# Patient Record
Sex: Male | Born: 1980 | Race: White | Hispanic: No | Marital: Married | Smoking: Never smoker
Health system: Southern US, Community
[De-identification: ages and names within clinical notes are randomized; demographics above are authoritative.]

## PROBLEM LIST (undated history)

## (undated) DIAGNOSIS — F419 Anxiety disorder, unspecified: Secondary | ICD-10-CM

---

## 2022-01-01 ENCOUNTER — Emergency Department (HOSPITAL_COMMUNITY): Payer: Managed Care, Other (non HMO)

## 2022-01-01 ENCOUNTER — Encounter (HOSPITAL_COMMUNITY): Payer: Self-pay | Admitting: *Deleted

## 2022-01-01 ENCOUNTER — Emergency Department (HOSPITAL_COMMUNITY)
Admission: EM | Admit: 2022-01-01 | Discharge: 2022-01-01 | Discharge status: Against Medical Advice | Payer: Managed Care, Other (non HMO) | Attending: Student | Admitting: Student

## 2022-01-01 ENCOUNTER — Other Ambulatory Visit: Payer: Self-pay

## 2022-01-01 DIAGNOSIS — R0789 Other chest pain: Secondary | ICD-10-CM | POA: Diagnosis not present

## 2022-01-01 DIAGNOSIS — R200 Anesthesia of skin: Secondary | ICD-10-CM | POA: Diagnosis not present

## 2022-01-01 DIAGNOSIS — R002 Palpitations: Secondary | ICD-10-CM | POA: Diagnosis not present

## 2022-01-01 DIAGNOSIS — Z5321 Procedure and treatment not carried out due to patient leaving prior to being seen by health care provider: Secondary | ICD-10-CM | POA: Diagnosis not present

## 2022-01-01 DIAGNOSIS — R079 Chest pain, unspecified: Secondary | ICD-10-CM

## 2022-01-01 HISTORY — DX: Anxiety disorder, unspecified: F41.9

## 2022-01-01 LAB — CBC
HCT: 45.6 % (ref 39.0–52.0)
Hemoglobin: 15.5 g/dL (ref 13.0–17.0)
MCH: 31.1 pg (ref 26.0–34.0)
MCHC: 34 g/dL (ref 30.0–36.0)
MCV: 91.4 fL (ref 80.0–100.0)
Platelets: 295 10*3/uL (ref 150–400)
RBC: 4.99 MIL/uL (ref 4.22–5.81)
RDW: 12.3 % (ref 11.5–15.5)
WBC: 11.7 10*3/uL — ABNORMAL HIGH (ref 4.0–10.5)
nRBC: 0 % (ref 0.0–0.2)

## 2022-01-01 LAB — RAPID URINE DRUG SCREEN, HOSP PERFORMED
Amphetamines: NOT DETECTED
Barbiturates: NOT DETECTED
Benzodiazepines: NOT DETECTED
Cocaine: NOT DETECTED
Opiates: NOT DETECTED
Tetrahydrocannabinol: NOT DETECTED

## 2022-01-01 LAB — TSH: TSH: 1.332 u[IU]/mL (ref 0.350–4.500)

## 2022-01-01 LAB — D-DIMER, QUANTITATIVE: D-Dimer, Quant: 0.36 ug/mL-FEU (ref 0.00–0.50)

## 2022-01-01 LAB — BASIC METABOLIC PANEL
Anion gap: 11 (ref 5–15)
BUN: 16 mg/dL (ref 6–20)
CO2: 24 mmol/L (ref 22–32)
Calcium: 10 mg/dL (ref 8.9–10.3)
Chloride: 102 mmol/L (ref 98–111)
Creatinine, Ser: 1.3 mg/dL — ABNORMAL HIGH (ref 0.61–1.24)
GFR, Estimated: >60 mL/min (ref >60)
Glucose, Bld: 113 mg/dL — ABNORMAL HIGH (ref 70–99)
Potassium: 4 mmol/L (ref 3.5–5.1)
Sodium: 137 mmol/L (ref 135–145)

## 2022-01-01 LAB — TROPONIN I (HIGH SENSITIVITY)
Troponin I (High Sensitivity): 6 ng/L
Troponin I (High Sensitivity): 5 ng/L (ref <18)

## 2022-01-01 LAB — MAGNESIUM: Magnesium: 2.2 mg/dL (ref 1.7–2.4)

## 2022-01-01 NOTE — ED Provider Triage Note (Signed)
Emergency Medicine Provider Triage Evaluation Note  Douglas Gonzalez , a 41 y.o. male  was evaluated in triage.  Pt complains of palpitations and left arm numbness ongoing for 1 week.  Patient states he has noticed his heart rate has ranged between 100 and 130 consistently during that time.  He denies shortness of breath, fever, loss of consciousness.  His medical history significant for anxiety and he takes both BuSpar and Seroquel daily  Review of Systems  Positive: Tachycardia, left arm numbness, chest pressure Negative: Loss of consciousness, jaw pain, fever  Physical Exam  BP (!) 166/114 (BP Location: Right Arm)    Pulse (!) 134    Temp 98.4 F (36.9 C) (Oral)    Resp 17    SpO2 99%  Gen:   Awake, no distress   Resp:  Normal effort, CTA bilaterally MSK:   Moves extremities without difficulty. Other:  Heart rate tachycardic, regular rhythm, skin diaphoretic  Medical Decision Making  Medically screening exam initiated at 1:47 PM.  Appropriate orders placed.  Douglas Gonzalez was informed that the remainder of the evaluation will be completed by another provider, this initial triage assessment does not replace that evaluation, and the importance of remaining in the ED until their evaluation is complete.     Douglas Gonzalez, [place] 01/01/22 1409

## 2022-01-01 NOTE — ED Triage Notes (Signed)
Pt reports HR being greater than 120 intermittent x 1 week, causing chest discomfort. Also having left side hand/finger numbness that radiates up his arm. Denies hx of tachycardia prior to this.

## 2022-01-01 NOTE — ED Notes (Signed)
Pt left and MSE signed  

## 2023-03-01 DIAGNOSIS — N62 Hypertrophy of breast: Secondary | ICD-10-CM | POA: Diagnosis not present

## 2023-03-01 DIAGNOSIS — Z6828 Body mass index (BMI) 28.0-28.9, adult: Secondary | ICD-10-CM | POA: Diagnosis not present

## 2023-03-01 DIAGNOSIS — E663 Overweight: Secondary | ICD-10-CM | POA: Diagnosis not present

## 2023-03-01 DIAGNOSIS — L709 Acne, unspecified: Secondary | ICD-10-CM | POA: Diagnosis not present

## 2023-03-01 DIAGNOSIS — Z0000 Encounter for general adult medical examination without abnormal findings: Secondary | ICD-10-CM | POA: Diagnosis not present

## 2023-03-01 DIAGNOSIS — L2084 Intrinsic (allergic) eczema: Secondary | ICD-10-CM | POA: Diagnosis not present

## 2023-03-01 DIAGNOSIS — J3089 Other allergic rhinitis: Secondary | ICD-10-CM | POA: Diagnosis not present

## 2023-03-01 DIAGNOSIS — Z79899 Other long term (current) drug therapy: Secondary | ICD-10-CM | POA: Diagnosis not present

## 2023-03-03 DIAGNOSIS — Z0000 Encounter for general adult medical examination without abnormal findings: Secondary | ICD-10-CM | POA: Diagnosis not present

## 2023-03-03 DIAGNOSIS — R899 Unspecified abnormal finding in specimens from other organs, systems and tissues: Secondary | ICD-10-CM | POA: Diagnosis not present

## 2023-03-03 DIAGNOSIS — Z1159 Encounter for screening for other viral diseases: Secondary | ICD-10-CM | POA: Diagnosis not present

## 2023-03-18 DIAGNOSIS — F331 Major depressive disorder, recurrent, moderate: Secondary | ICD-10-CM | POA: Diagnosis not present

## 2023-03-18 DIAGNOSIS — F410 Panic disorder [episodic paroxysmal anxiety] without agoraphobia: Secondary | ICD-10-CM | POA: Diagnosis not present

## 2023-03-18 DIAGNOSIS — F411 Generalized anxiety disorder: Secondary | ICD-10-CM | POA: Diagnosis not present

## 2023-04-15 DIAGNOSIS — F411 Generalized anxiety disorder: Secondary | ICD-10-CM | POA: Diagnosis not present

## 2023-04-15 DIAGNOSIS — F410 Panic disorder [episodic paroxysmal anxiety] without agoraphobia: Secondary | ICD-10-CM | POA: Diagnosis not present

## 2023-04-15 DIAGNOSIS — F331 Major depressive disorder, recurrent, moderate: Secondary | ICD-10-CM | POA: Diagnosis not present

## 2023-04-27 DIAGNOSIS — M4317 Spondylolisthesis, lumbosacral region: Secondary | ICD-10-CM | POA: Diagnosis not present

## 2023-04-27 DIAGNOSIS — Z79899 Other long term (current) drug therapy: Secondary | ICD-10-CM | POA: Diagnosis not present

## 2023-04-29 DIAGNOSIS — F411 Generalized anxiety disorder: Secondary | ICD-10-CM | POA: Diagnosis not present

## 2023-04-29 DIAGNOSIS — F410 Panic disorder [episodic paroxysmal anxiety] without agoraphobia: Secondary | ICD-10-CM | POA: Diagnosis not present

## 2023-04-29 DIAGNOSIS — F331 Major depressive disorder, recurrent, moderate: Secondary | ICD-10-CM | POA: Diagnosis not present

## 2023-05-12 DIAGNOSIS — J029 Acute pharyngitis, unspecified: Secondary | ICD-10-CM | POA: Diagnosis not present

## 2023-05-12 DIAGNOSIS — J209 Acute bronchitis, unspecified: Secondary | ICD-10-CM | POA: Diagnosis not present

## 2023-05-19 DIAGNOSIS — F410 Panic disorder [episodic paroxysmal anxiety] without agoraphobia: Secondary | ICD-10-CM | POA: Diagnosis not present

## 2023-05-19 DIAGNOSIS — F411 Generalized anxiety disorder: Secondary | ICD-10-CM | POA: Diagnosis not present

## 2023-05-19 DIAGNOSIS — F331 Major depressive disorder, recurrent, moderate: Secondary | ICD-10-CM | POA: Diagnosis not present

## 2023-06-28 ENCOUNTER — Encounter: Payer: Self-pay | Admitting: Behavioral Health

## 2023-06-28 ENCOUNTER — Ambulatory Visit (INDEPENDENT_AMBULATORY_CARE_PROVIDER_SITE_OTHER): Payer: 59 | Admitting: Behavioral Health

## 2023-06-28 DIAGNOSIS — F422 Mixed obsessional thoughts and acts: Secondary | ICD-10-CM

## 2023-06-28 DIAGNOSIS — F410 Panic disorder [episodic paroxysmal anxiety] without agoraphobia: Secondary | ICD-10-CM

## 2023-06-28 DIAGNOSIS — F411 Generalized anxiety disorder: Secondary | ICD-10-CM | POA: Diagnosis not present

## 2023-06-28 MED ORDER — ALPRAZOLAM 0.5 MG PO TABS
0.5000 mg | ORAL_TABLET | Freq: Two times a day (BID) | ORAL | 0 refills | Status: DC | PRN
Start: 2023-06-28 — End: 2023-07-22

## 2023-06-28 MED ORDER — ALPRAZOLAM ER 1 MG PO TB24
1.0000 mg | ORAL_TABLET | Freq: Two times a day (BID) | ORAL | 0 refills | Status: DC
Start: 2023-06-28 — End: 2023-07-22

## 2023-06-28 NOTE — Progress Notes (Signed)
[facility] MD/PA/NP Initial Note  06/28/2023 6:17 PM Douglas Gonzalez  MRN:  [id number]  Chief Complaint:  Chief Complaint   Anxiety; Establish Care; Medication Refill; Patient Education; Medication Problem; OCD     HPI:  "Douglas Gonzalez", 42 year old male presents to this office for initial visit and to establish care.  Collateral information should be considered reliable.  Most recently getting psychiatric care from [facility] in [facility].  Patient is very articulate and detailed.  Very thorough and expressing his concerns.  Appears to be very fixated on details that may collaborate with his previous OCD diagnosis.  He says that he was first exposed to mental health care at the age of 46.  He has received psychiatric care from several different providers since that time.  Says that as a young child his parents noticed that he worried a lot and sometimes he would have ritualistic behaviors or preoccupation with counting.  Says that with his OCD he suffers from severe anxiety and panic disorder.  Feels like depression is mild.  Says that his father passed from pancreatic cancer in June 2023, and feels like this may have triggered some exacerbation of his OCD.  He also experienced an increase in EtOH consumption to an unhealthy level.  Says that he has not had any EtOH in approximately 8 months.  Then in February 2024 he was laid off from his job adding to his stress.  Says that he is here today to find a new provider that can help manage his medications but also hopefully find a better medication regimen.  He says that he has a very long history of psychiatric medication trials but candidly admits to not giving all of the medication enough time to work.  He also is currently taking a higher dose of Xanax which was previously prescribed.  He regularly uses opioid pain medication for chronic back pain.  He says his anxiety today is 5/10, and depression is 3/10.  Concerned about his lack of quality sleep  and reports only getting about 3 to 4 hours per night.  He is interested in completing GeneSight testing.  His PHQ 2 was negative.  His MDQ was negative with only 2 criteria on Marked yes.  He endorses racing thoughts and trouble concentrating.  He denies any current mania, no history of psychosis, no auditory or visual hallucinations.  He denies SI or HI.  He verbally contracts for safety with this Clinical research associate.  Past psychiatric medication trials: Zoloft Effexor Rexulti BuSpar Xanax Klonopin Celexa Lexapro Paxil Wellbutrin Seroquel Abilify Lunesta Lamictal Ativan Propranolol Hydroxyzine Gabapentin Vraylar BuSpar     Visit Diagnosis:    ICD-10-CM   1. Generalized anxiety disorder  F41.1 ALPRAZolam (XANAX XR) 1 MG 24 hr tablet    ALPRAZolam (XANAX) 0.5 MG tablet    2. Mixed obsessional thoughts and acts  F42.2     3. Panic attack  F41.0 ALPRAZolam (XANAX XR) 1 MG 24 hr tablet    ALPRAZolam (XANAX) 0.5 MG tablet      Past Psychiatric History: OCD, Gen Anxiety, Panic Disorder  Past Medical History:  Past Medical History:  Diagnosis Date   Anxiety    History reviewed. No pertinent surgical history.  Family Psychiatric History: none noted this visit. Time restriction, obtain next visit.  Family History: History reviewed. No pertinent family history.  Social History:  Social History   Socioeconomic History   Marital status: Married    Spouse name: Not on file   Number of children:  Not on file   Years of education: Not on file   Highest education level: Not on file  Occupational History   Not on file  Tobacco Use   Smoking status: Never   Smokeless tobacco: Current  Substance and Sexual Activity   Alcohol use: Yes   Drug use: Never   Sexual activity: Not on file  Other Topics Concern   Not on file  Social History Narrative   Not on file   Social Determinants of Health   Financial Resource Strain: Low Risk  (06/08/2022)   [facility],  [facility] prior to 01/23/[facility]23[facility] prior to 01/23/2023.   Overall Financial Resource Strain (CARDIA)    Difficulty of Paying Living Expenses: Not hard at all  Food Insecurity: Low Risk  (02/25/2023)   [facility]   Food vital sign    Within the past 12 months, you worried that your food would run out before you got money to buy more: Never true    Within the past 12 months, the food you bought just didn't last and you didn't have money to get more. : Never true  Transportation Needs: No Transportation Needs (02/25/2023)   [facility]   Transportation    In the past 12 months, has lack of reliable transportation kept you from medical appointments, meetings, work or from getting things needed for daily living? : No  Physical Activity: Insufficiently Active (06/08/2022)   [facility], [facility] prior to 01/23/2023., [facility] visits prior to 01/23/2023.   Exercise Vital Sign    Days of Exercise per Week: 1 day    Minutes of Exercise per Session: 30 min  Stress: Stress Concern Present (06/08/2022)   [facility], [facility] prior to 01/23/2023., [facility] visits prior to 01/23/2023.   [employer] of Occupational Health - Occupational Stress Questionnaire    Feeling of Stress : Very much  Social Connections: Moderately Integrated (06/08/2022)   [facility], [facility] prior to 01/23/2023., [facility] visits prior to 01/23/2023.   Social Advertising account executive [NHANES]    Frequency of Communication with Friends and Family: More than three times a week    Frequency of Social Gatherings with Friends and Family: More  than three times a week    Attends Religious Services: More than 4 times per year    Active Member of [employer] or Organizations: No    Attends Banker Meetings: Never    Marital Status: Married    Allergies:  Allergies  Allergen Reactions   Neosporin [Bacitracin-Polymyxin B]     Metabolic Disorder Labs: No results found for: "HGBA1C", "MPG" No results found for: "PROLACTIN" No results found for: "CHOL", "TRIG", "HDL", "CHOLHDL", "VLDL", "LDLCALC" Lab Results  Component Value Date   TSH 1.332 01/01/2022    Therapeutic Level Labs: No results found for: "LITHIUM" No results found for: "VALPROATE" No results found for: "CBMZ"  Current Medications: Current Outpatient Medications  Medication Sig Dispense Refill   ALPRAZolam (XANAX) 1 MG tablet Take by mouth.     cyclobenzaprine (FLEXERIL) 10 MG tablet Take by mouth.     fluticasone (FLONASE) 50 MCG/ACT nasal spray Place into the  nose.     naloxone (NARCAN) nasal spray 4 mg/0.1 mL Place into the nose.     ALPRAZolam (XANAX XR) 1 MG 24 hr tablet Take 1 tablet (1 mg total) by mouth 2 (two) times daily. 60 tablet 0   ALPRAZolam (XANAX) 0.5 MG tablet Take 1 tablet (0.5 mg total) by mouth 2 (two) times daily as needed for anxiety. 30 tablet 0   busPIRone (BUSPAR) 15 MG tablet Take 15 mg by mouth 3 (three) times daily.     montelukast (SINGULAIR) 10 MG tablet Take 10 mg by mouth daily.     zolpidem (AMBIEN) 10 MG tablet Take 10 mg by mouth at bedtime as needed. for sleep     No current facility-administered medications for this visit.    Medication Side Effects: none  Orders placed this visit:  No orders of the defined types were placed in this encounter.   Psychiatric Specialty Exam:  Review of Systems  Constitutional:  Positive for unexpected weight change.  Allergic/Immunologic: Positive for environmental allergies.  Neurological: Negative.   Psychiatric/Behavioral:  Positive for dysphoric mood and sleep  disturbance. The patient is nervous/anxious.     Blood pressure 125/79, pulse 75, height 6\' 4"  (1.93 m), weight 183 lb (83 kg).Body mass index is 22.28 kg/m.  General Appearance: Casual, Neat, Well Groomed, and Meticulous  Eye Contact:  Good  Speech:  Clear and Coherent and Talkative  Volume:  Normal  Mood:  Anxious and Depressed  Affect:  Depressed and Anxious  Thought Process:  Coherent  Orientation:  Full (Time, Place, and Person)  Thought Content: Obsessions and Rumination   Suicidal Thoughts:  No  Homicidal Thoughts:  No  Memory:  WNL  Judgement:  Fair  Insight:  Fair  Psychomotor Activity:  Normal  Concentration:  Concentration: Good  Recall:  Good  Fund of Knowledge: Good  Language: Good  Assets:  Desire for Improvement  ADL's:  Intact  Cognition: WNL  Prognosis:  Good   Screenings:  Flowsheet Row ED from [facility]/01/2022 in [facility] Emergency Department at [facility]  C-SSRS RISK CATEGORY No Risk       Receiving Psychotherapy: No   Treatment Plan/Recommendations:  Greater than 50% of  90 min face to face time with patient was spent on counseling and coordination of care. We discussed his long-term problems with OCD, anxiety, and depression.  We also discussed his very lengthy list of prior psychiatric medication trials.  We talked about his previous care under various providers since the age of 34.  We discussed his goals for care at this practice.  I had a very candid conversation with him about his lengthy list of medications and my concerns about his expectations, and barriers to certain medications.  He has a significant amount of anxiety and prerequisites for medications.  We conducted GeneSight packet today in hopes that it will reinforce confidence in future medication trials.  We will not start any new medication until we have discussed the results.  We also discussed my concerns about his healthy doses of benzo and his use of narcotic pain medication.   Patient does have a prescription of Narcan available.  Advised patient to not take Xanax at same time as pain medication.  Planning will involve patient being willing to wean down on his current Xanax dose.  We agreed today to: Will continue Xanax 1 mg XR twice daily Will continue 0.5 mg twice daily as needed for panic. To continue zolpidem 10  mg at bedtime Will continue BuSpar 15 mg 3 times daily Will report worsening symptoms or side effects promptly Provided emergency contact information To follow-up in 3 weeks to reassess Discussed potential benefits, risk, and side effects of benzodiazepines to include potential risk of tolerance and dependence, as well as possible drowsiness.  Advised patient not to drive if experiencing drowsiness and to take lowest possible effective dose to minimize risk of dependence and tolerance.  Reviewed PDMP   Joan Flores, NP

## 2023-06-29 ENCOUNTER — Telehealth: Payer: Self-pay

## 2023-06-29 IMAGING — DX DG CHEST 2V
2 series · 2 of 2 positions shown · non-contrast
Comparison: None

CLINICAL DATA: Chest pain, tachycardia, LEFT hand and finger
numbness radiating up arm

EXAM:
CHEST - 2 VIEW

[w chest pa]
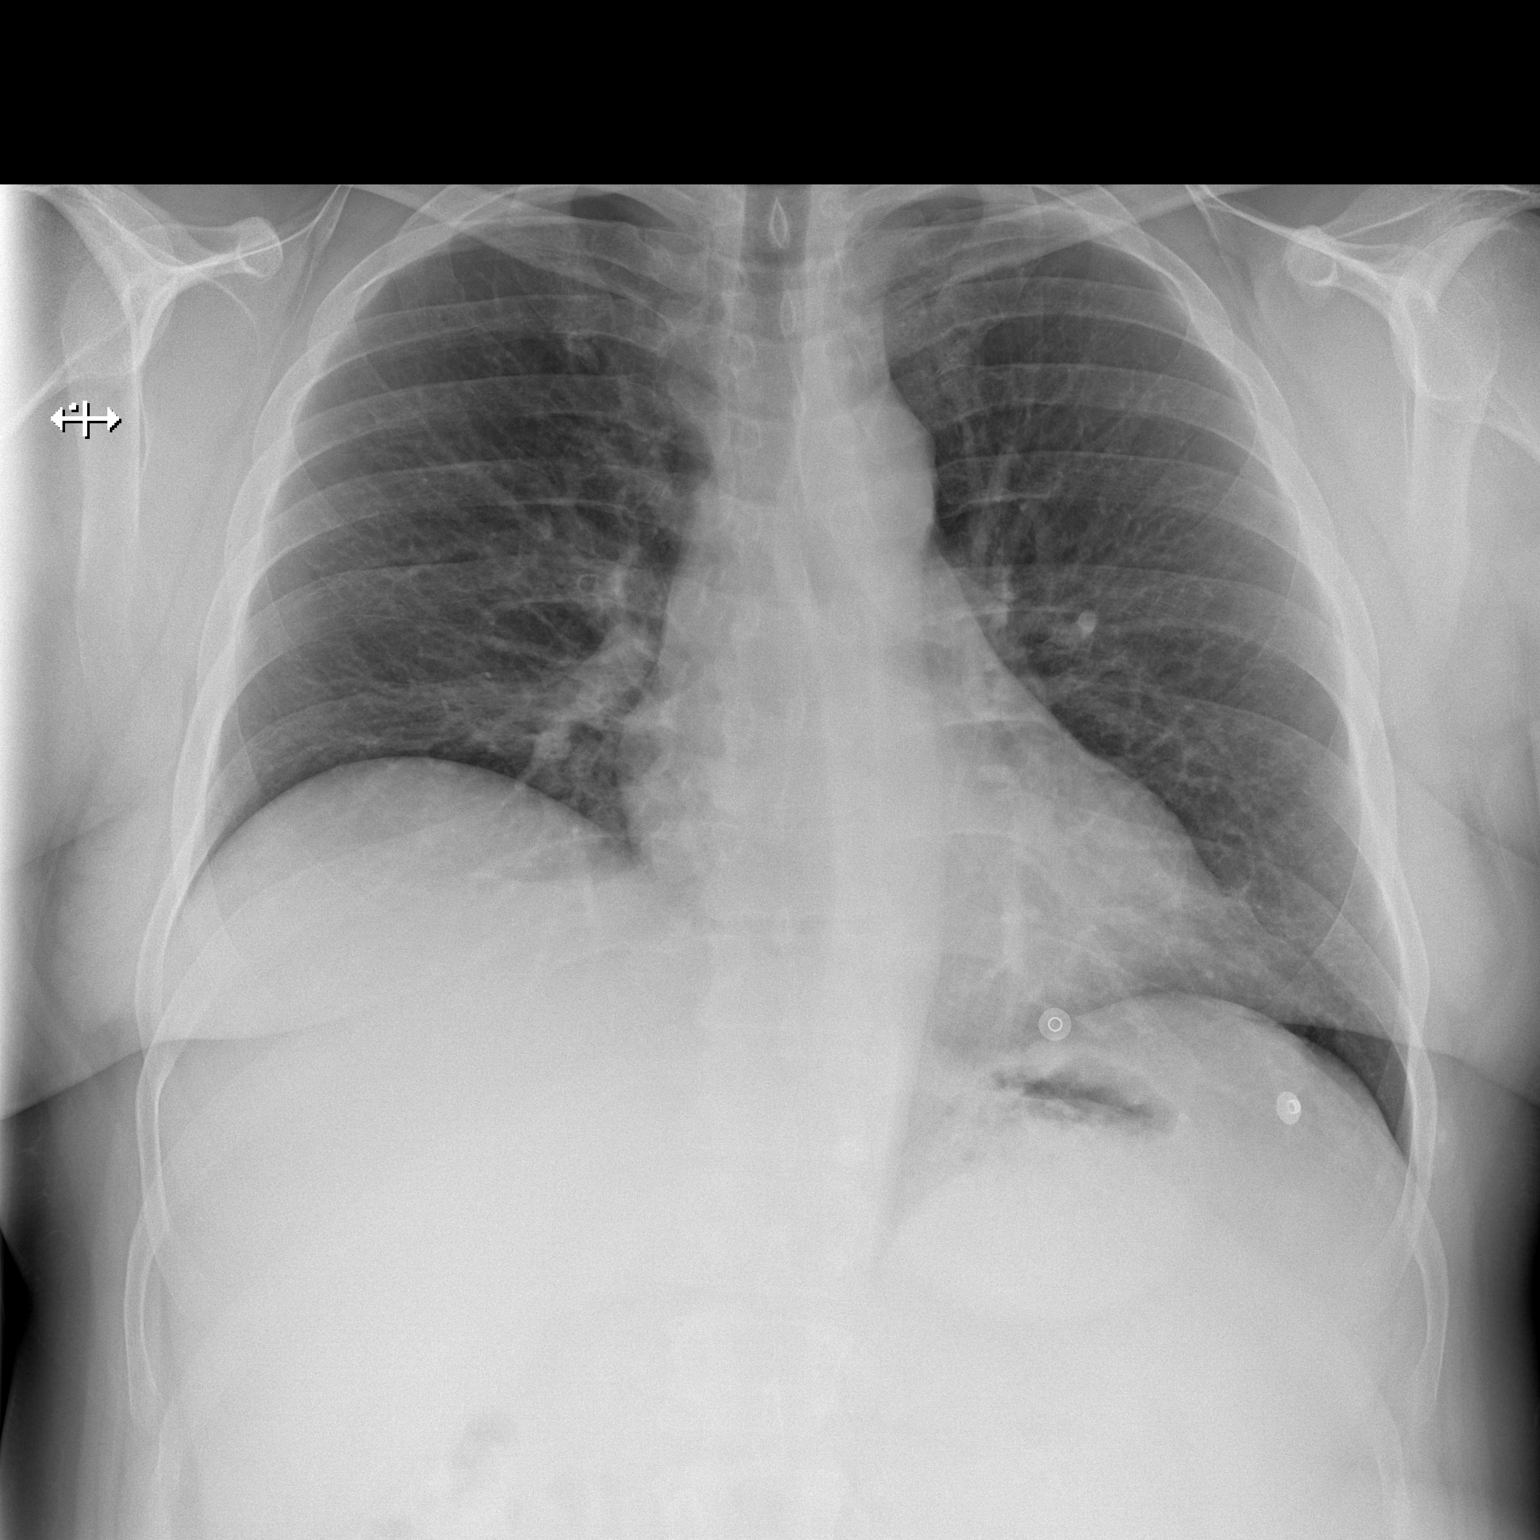

[w chest lat]
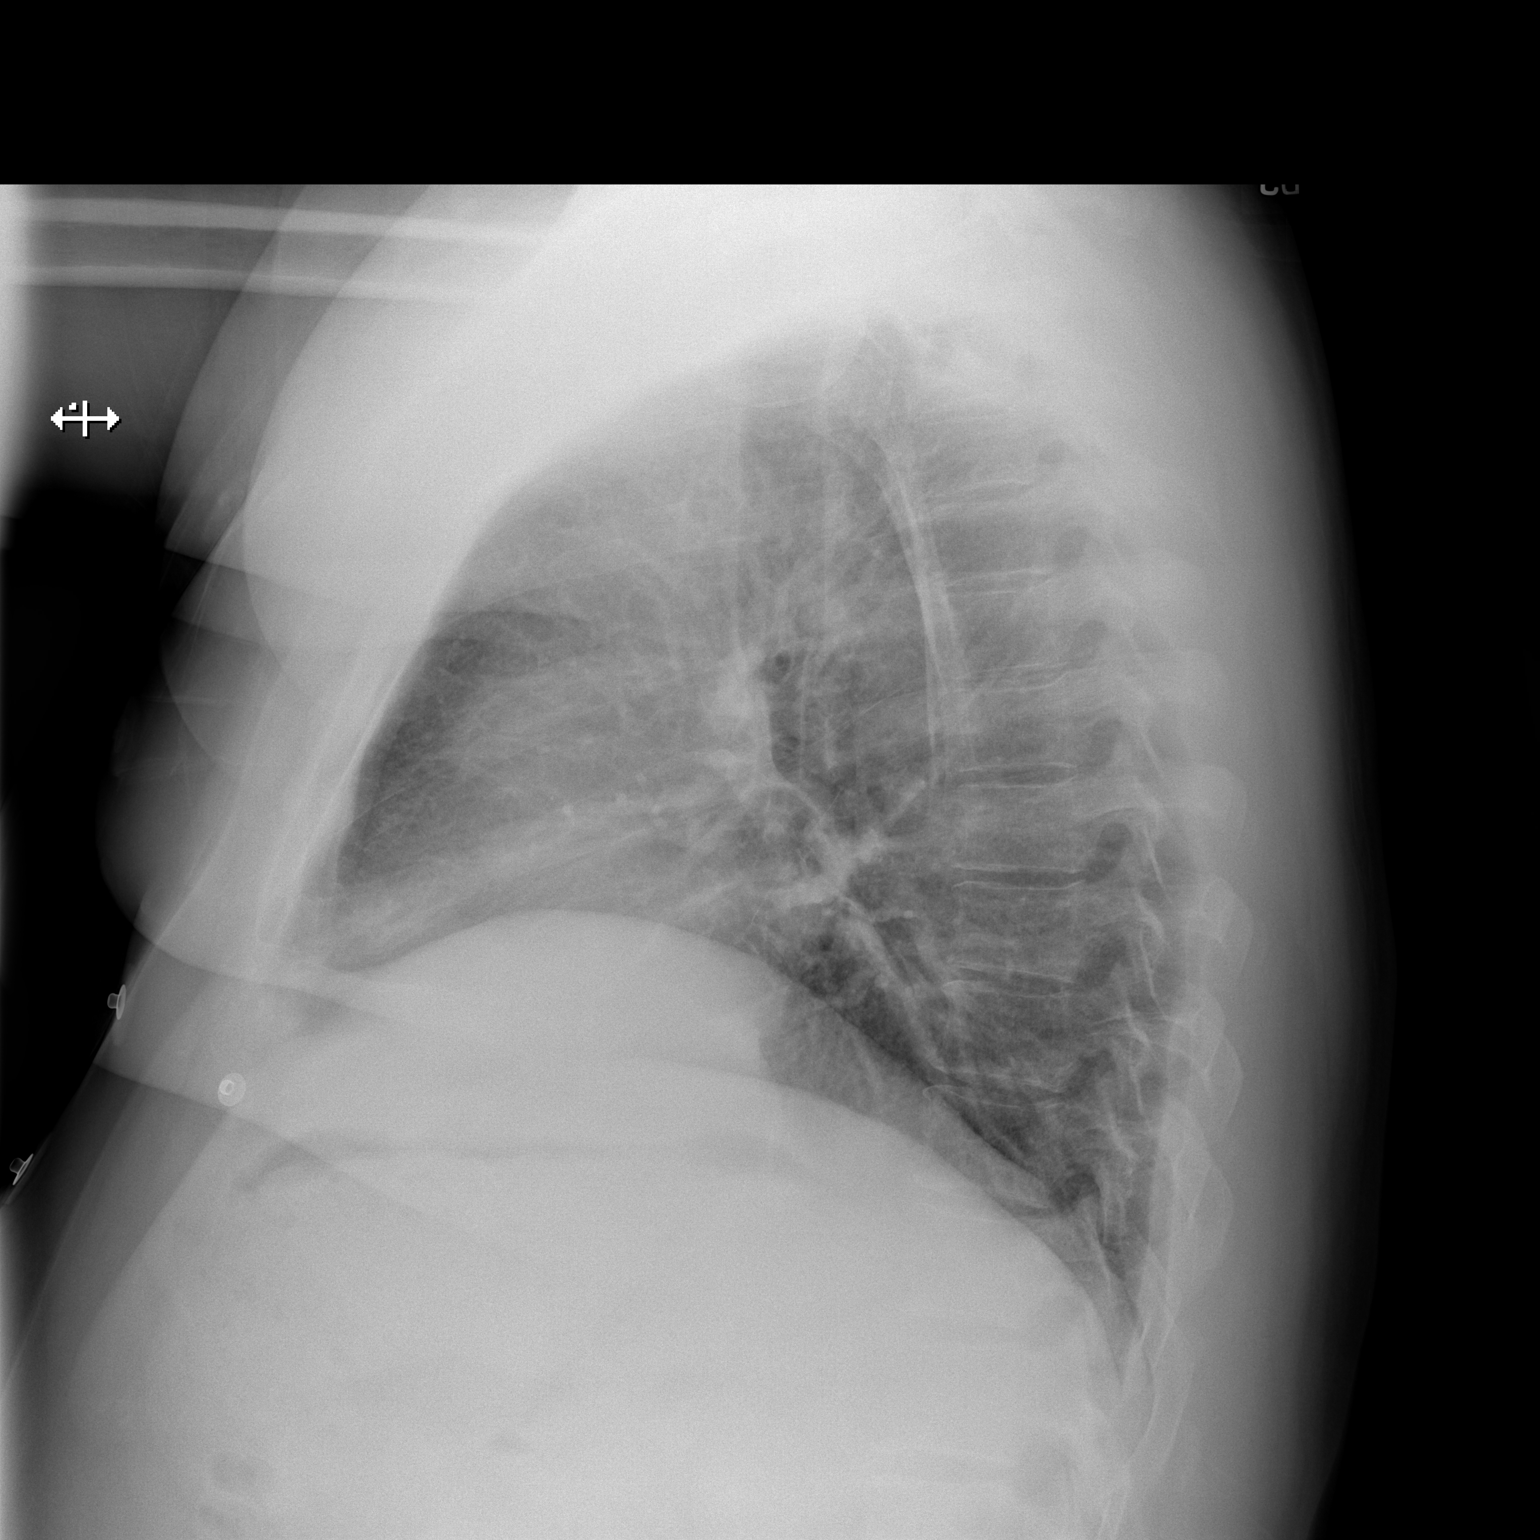

[2 of 2 positions shown; findings below may reference images not displayed]

FINDINGS: Upper-normal size of cardiac silhouette.

Mediastinal contours and pulmonary vascularity normal.

Elevation of RIGHT diaphragm with RIGHT basilar subsegmental
atelectasis.

Remaining lungs clear.

No infiltrate, pleural effusion, or pneumothorax.

Osseous structures unremarkable.
IMPRESSION: Subsegmental atelectasis RIGHT base.

## 2023-06-29 NOTE — Telephone Encounter (Signed)
Patient notified. He said that he has spondylolisthesis and that is why he takes the pain medication, but that the mind trumps the physical pain and if he needs more benzo then he can eliminate the pain medication. He has FU 8/28. I told him that Arlys John would re-evaluate at that time.

## 2023-07-08 ENCOUNTER — Telehealth: Payer: Self-pay | Admitting: Behavioral Health

## 2023-07-08 ENCOUNTER — Other Ambulatory Visit: Payer: Self-pay

## 2023-07-08 MED ORDER — BUSPIRONE HCL 15 MG PO TABS: 15.0000 mg | ORAL_TABLET | Freq: Three times a day (TID) | ORAL | 0 refills | Status: DC

## 2023-07-08 MED ORDER — ZOLPIDEM TARTRATE 10 MG PO TABS: 10.0000 mg | ORAL_TABLET | Freq: Every evening | ORAL | 0 refills | Status: DC | PRN

## 2023-07-08 NOTE — Telephone Encounter (Signed)
Patient called in for refills on Buspar 15mg  and Ambien 10mg . Ph: [phone] Appt 8/28 [facility] [address] [place]

## 2023-07-08 NOTE — Telephone Encounter (Signed)
Sent/pended 

## 2023-07-12 ENCOUNTER — Other Ambulatory Visit: Payer: Self-pay | Admitting: Behavioral Health

## 2023-07-12 DIAGNOSIS — F410 Panic disorder [episodic paroxysmal anxiety] without agoraphobia: Secondary | ICD-10-CM

## 2023-07-12 DIAGNOSIS — F411 Generalized anxiety disorder: Secondary | ICD-10-CM

## 2023-07-21 ENCOUNTER — Ambulatory Visit: Payer: 59 | Admitting: Behavioral Health

## 2023-07-22 ENCOUNTER — Ambulatory Visit: Payer: 59 | Admitting: Behavioral Health

## 2023-07-22 ENCOUNTER — Telehealth: Payer: Self-pay | Admitting: Behavioral Health

## 2023-07-22 ENCOUNTER — Encounter: Payer: Self-pay | Admitting: Behavioral Health

## 2023-07-22 DIAGNOSIS — F411 Generalized anxiety disorder: Secondary | ICD-10-CM | POA: Diagnosis not present

## 2023-07-22 DIAGNOSIS — F422 Mixed obsessional thoughts and acts: Secondary | ICD-10-CM

## 2023-07-22 DIAGNOSIS — F5105 Insomnia due to other mental disorder: Secondary | ICD-10-CM | POA: Diagnosis not present

## 2023-07-22 DIAGNOSIS — F410 Panic disorder [episodic paroxysmal anxiety] without agoraphobia: Secondary | ICD-10-CM

## 2023-07-22 DIAGNOSIS — F99 Mental disorder, not otherwise specified: Secondary | ICD-10-CM

## 2023-07-22 MED ORDER — ALPRAZOLAM 0.5 MG PO TABS
0.5000 mg | ORAL_TABLET | Freq: Two times a day (BID) | ORAL | 1 refills | Status: DC | PRN
Start: 2023-07-22 — End: 2023-08-04

## 2023-07-22 MED ORDER — BUSPIRONE HCL 15 MG PO TABS
15.0000 mg | ORAL_TABLET | Freq: Three times a day (TID) | ORAL | 1 refills | Status: DC
Start: 2023-07-22 — End: 2023-08-28

## 2023-07-22 MED ORDER — ALPRAZOLAM ER 1 MG PO TB24
1.0000 mg | ORAL_TABLET | Freq: Two times a day (BID) | ORAL | 1 refills | Status: DC
Start: 2023-07-22 — End: 2023-08-04

## 2023-07-22 MED ORDER — QUETIAPINE FUMARATE 100 MG PO TABS
ORAL_TABLET | ORAL | 1 refills | Status: DC
Start: 2023-07-22 — End: 2023-09-12

## 2023-07-22 NOTE — Telephone Encounter (Signed)
Douglas Gonzalez has not finished his note. He last filled alprazolam 8/5.

## 2023-07-22 NOTE — Telephone Encounter (Signed)
Scripts have been sent

## 2023-07-22 NOTE — Progress Notes (Signed)
[facility] Med Check  Patient ID: Douglas Gonzalez,  MRN: [id number]  PCP: Loyal Jacobson, MD  Date of Evaluation: 07/22/2023 Time spent:40 minutes  Chief Complaint:  Chief Complaint   Anxiety; Depression; Follow-up; Medication Refill; Patient Education; OCD     HISTORY/CURRENT STATUS: HPI  "Douglas Gonzalez", 42 year old male presents to this office for initial visit and to establish care.  He also is currently taking a higher dose of Xanax which was previously prescribed.  He regularly uses opioid pain medication for chronic back pain. He wanted to discuss medication alternatives today. Genesight results are not in yet. Would like to discuss sleeping medications because he is still struggling.   He says his anxiety today is 5/10, and depression is 3/10.  Concerned about his lack of quality sleep and reports only getting about 4-5 hours per night after he self initiated Seroquel again.  He would like to come back in two weeks to discuss AD'S.  He denies any current mania, no history of psychosis, no auditory or visual hallucinations.  He denies SI or HI.  He verbally contracts for safety with this Clinical research associate.   Past psychiatric medication trials: Zoloft Effexor Rexulti BuSpar Xanax Klonopin Celexa Lexapro Paxil Wellbutrin Seroquel Abilify Lunesta Lamictal Ativan Propranolol Hydroxyzine Gabapentin Vraylar BuSpar       Individual Medical History/ Review of Systems: Changes? :No   Allergies: Neosporin [bacitracin-polymyxin b]  Current Medications:  Current Outpatient Medications:    QUEtiapine (SEROQUEL) 100 MG tablet, Take 1/2 to 1 tablet by mouth at bedtime daily for insomnia, and anxiety., Disp: 30 tablet, Rfl: 1   ALPRAZolam (XANAX XR) 1 MG 24 hr tablet, Take 1 tablet (1 mg total) by mouth 2 (two) times daily., Disp: 60 tablet, Rfl: 1   ALPRAZolam (XANAX) 0.5 MG tablet, Take 1 tablet (0.5 mg total) by mouth 2 (two) times daily as needed for anxiety., Disp: 60 tablet,  Rfl: 1   ALPRAZolam (XANAX) 1 MG tablet, Take by mouth., Disp: , Rfl:    busPIRone (BUSPAR) 15 MG tablet, Take 1 tablet (15 mg total) by mouth 3 (three) times daily., Disp: 90 tablet, Rfl: 1   cyclobenzaprine (FLEXERIL) 10 MG tablet, Take by mouth., Disp: , Rfl:    fluticasone (FLONASE) 50 MCG/ACT nasal spray, Place into the nose., Disp: , Rfl:    montelukast (SINGULAIR) 10 MG tablet, Take 10 mg by mouth daily., Disp: , Rfl:    naloxone (NARCAN) nasal spray 4 mg/0.1 mL, Place into the nose., Disp: , Rfl:  Medication Side Effects: none  Family Medical/ Social History: Changes? No  MENTAL HEALTH EXAM:  There were no vitals taken for this visit.There is no height or weight on file to calculate BMI.  General Appearance: Casual, Neat, and Well Groomed  Eye Contact:  Good  Speech:  Clear and Coherent  Volume:  Normal  Mood:  Anxious and Depressed  Affect:  Appropriate  Thought Process:  Coherent  Orientation:  Full (Time, Place, and Person)  Thought Content: Rumination   Suicidal Thoughts:  No  Homicidal Thoughts:  No  Memory:  WNL  Judgement:  Fair  Insight:  Fair  Psychomotor Activity:  Normal  Concentration:  Concentration: Good  Recall:  Good  Fund of Knowledge: Good  Language: Good  Assets:  Desire for Improvement  ADL's:  Intact  Cognition: WNL  Prognosis:  Good    DIAGNOSES:    ICD-10-CM   1. Generalized anxiety disorder  F41.1 ALPRAZolam (XANAX XR) 1 MG 24 hr tablet  ALPRAZolam (XANAX) 0.5 MG tablet    busPIRone (BUSPAR) 15 MG tablet    QUEtiapine (SEROQUEL) 100 MG tablet    2. Mixed obsessional thoughts and acts  F42.2 QUEtiapine (SEROQUEL) 100 MG tablet    3. Panic attack  F41.0 ALPRAZolam (XANAX XR) 1 MG 24 hr tablet    ALPRAZolam (XANAX) 0.5 MG tablet    4. Insomnia due to other mental disorder  F51.05 QUEtiapine (SEROQUEL) 100 MG tablet   F99       Receiving Psychotherapy: yes   RECOMMENDATIONS:    Greater than 50% of  40  min face to face time  with patient was spent on counseling and coordination of care. We reviewed his sleeping issues. He self initiated old RX of Seroquel to help with sleep. I agreed to write new RX. He wanted to wait on GeneSight results to discuss AD"s.  Patient does have a prescription of Narcan available.  Advised patient to not take Xanax at same time as pain medication.  Planning will involve patient being willing to wean down on his current Xanax dose.   We agreed today to:  Will continue Xanax 1 mg XR twice daily Will continue 0.5 mg twice daily as needed for panic. To stop zolpidem 10 mg at bedtime, not effective Will continue BuSpar 15 mg 3 times daily Will report worsening symptoms or side effects promptly Provided emergency contact information To follow-up in 2 weeks to reassess Discussed potential benefits, risk, and side effects of benzodiazepines to include potential risk of tolerance and dependence, as well as possible drowsiness.  Advised patient not to drive if experiencing drowsiness and to take lowest possible effective dose to minimize risk of dependence and tolerance.  Discussed potential metabolic side effects associated with atypical antipsychotics, as well as potential risk for movement side effects. Advised pt to contact office if movement side effects occur.   Reviewed PDMP      Joan Flores, NP

## 2023-07-22 NOTE — Telephone Encounter (Signed)
Laren called at 2:00 asking about his prescriptions from appt today.  He said he was due both his Xanax prescriptions and a new prescriptions for Seroquel.  He was unsure when they would be sent.  I told him that they would typically be sent in the Dr. When due. Nexte appt 9/11.  [id number] [place], [place] - [address]

## 2023-08-04 ENCOUNTER — Ambulatory Visit: Payer: 59 | Admitting: Behavioral Health

## 2023-08-04 ENCOUNTER — Telehealth: Payer: Self-pay

## 2023-08-04 ENCOUNTER — Telehealth: Payer: Self-pay | Admitting: Behavioral Health

## 2023-08-04 DIAGNOSIS — F410 Panic disorder [episodic paroxysmal anxiety] without agoraphobia: Secondary | ICD-10-CM

## 2023-08-04 DIAGNOSIS — F411 Generalized anxiety disorder: Secondary | ICD-10-CM

## 2023-08-04 DIAGNOSIS — F422 Mixed obsessional thoughts and acts: Secondary | ICD-10-CM | POA: Diagnosis not present

## 2023-08-04 MED ORDER — VILAZODONE HCL 20 MG PO TABS
ORAL_TABLET | ORAL | 1 refills | Status: DC
Start: 2023-08-04 — End: 2024-03-28

## 2023-08-04 MED ORDER — ALPRAZOLAM 1 MG PO TABS: 1.0000 mg | ORAL_TABLET | Freq: Three times a day (TID) | ORAL | 0 refills | Status: DC | PRN

## 2023-08-04 NOTE — Telephone Encounter (Signed)
Douglas Gonzalez asked me to call pharmacy and cancel any scripts for Xanax XR and any scripts for Xanax IR except for the one sent today for 1 mg TID. Patient will not get but one RF at a time and he needs to bring in his bottle of medication at each visit to verify count.   Called pharmacy and canceled all scripts except the one for Xanax IR sent today.

## 2023-08-04 NOTE — Telephone Encounter (Signed)
Douglas Gonzalez brought in his alprazolam pills for Douglas Gonzalez to have. He needed to turn in his unused pills. These pills were given to B. White. [facility] [place].

## 2023-08-04 NOTE — Progress Notes (Signed)
[facility]  Patient ID: Douglas Gonzalez,  MRN: [id number]  PCP: Douglas Jacobson, MD  Date of Evaluation: 08/06/2023 Time spent:45 minutes  Chief Complaint:  Chief Complaint   Depression; Anxiety; Follow-up; Patient Education; Medication Refill; Medication Problem     HISTORY/CURRENT STATUS: HPI "Douglas Gonzalez", 42 year old male presents to this office for initial visit and to establish care.  Patient still says he is having problems with anxiety and panic attacks.  He also is currently taking a higher dose of Xanax which was previously prescribed. Burning through xanax faster than he should and now believes the extended release in not working well enough. He regularly uses opioid pain medication for chronic back pain. He wanted to review GeneSight results today to consider other medication options.  Would like to discuss sleeping medications because he is still struggling.   He says his anxiety today is 5/10, and depression is 3/10.  Concerned about his lack of quality sleep and reports only getting about 4-5 hours per night after he self initiated Seroquel again.  He would like to come back in two weeks to discuss AD'S.  He denies any current mania, no history of psychosis, no auditory or visual hallucinations.  He denies SI or HI.  He verbally contracts for safety with this Clinical research associate.   Past psychiatric medication trials: Zoloft Effexor Rexulti BuSpar Xanax Klonopin Celexa Lexapro Paxil Wellbutrin Seroquel Abilify Lunesta Lamictal Ativan Propranolol Hydroxyzine Gabapentin Vraylar BuSpar              Individual Medical History/ Review of Systems: Changes? :No   Allergies: Neosporin [bacitracin-polymyxin b]  Current Medications:  Current Outpatient Medications:    ALPRAZolam (XANAX) 1 MG tablet, Take 1 tablet (1 mg total) by mouth 3 (three) times daily as needed for anxiety., Disp: 90 tablet, Rfl: 0   Vilazodone HCl 20 MG TABS, Take 1/2 tablet for 7 days,  then take one whole tablet daily. Must take with food., Disp: 30 tablet, Rfl: 1   busPIRone (BUSPAR) 15 MG tablet, Take 1 tablet (15 mg total) by mouth 3 (three) times daily., Disp: 90 tablet, Rfl: 1   cyclobenzaprine (FLEXERIL) 10 MG tablet, Take by mouth., Disp: , Rfl:    fluticasone (FLONASE) 50 MCG/ACT nasal spray, Place into the nose., Disp: , Rfl:    montelukast (SINGULAIR) 10 MG tablet, Take 10 mg by mouth daily., Disp: , Rfl:    naloxone (NARCAN) nasal spray 4 mg/0.1 mL, Place into the nose., Disp: , Rfl:    QUEtiapine (SEROQUEL) 100 MG tablet, Take 1/2 to 1 tablet by mouth at bedtime daily for insomnia, and anxiety., Disp: 30 tablet, Rfl: 1 Medication Side Effects: none  Family Medical/ Social History: Changes? No  MENTAL HEALTH EXAM:  There were no vitals taken for this visit.There is no height or weight on file to calculate BMI.  General Appearance: Casual, Neat, and Well Groomed  Eye Contact:  Good  Speech:  Clear and Coherent  Volume:  Normal  Mood:  Anxious, Depressed, and Dysphoric  Affect:  Congruent, Depressed, and Anxious  Thought Process:  Coherent  Orientation:  Full (Time, Place, and Person)  Thought Content: Rumination   Suicidal Thoughts:  No  Homicidal Thoughts:  No  Memory:  WNL  Judgement:  Fair  Insight:  Fair  Psychomotor Activity:  Normal  Concentration:  Concentration: Fair  Recall:  Good  Fund of Knowledge: Good  Language: Good  Assets:  Desire for Improvement Resilience Social Support  ADL's:  Intact  Cognition: WNL  Prognosis:  Good    DIAGNOSES:    ICD-10-CM   1. Generalized anxiety disorder  F41.1 ALPRAZolam (XANAX) 1 MG tablet    Vilazodone HCl 20 MG TABS    2. Mixed obsessional thoughts and acts  F42.2 ALPRAZolam (XANAX) 1 MG tablet    Vilazodone HCl 20 MG TABS    3. Panic attack  F41.0 ALPRAZolam (XANAX) 1 MG tablet    Vilazodone HCl 20 MG TABS      Receiving Psychotherapy: No    RECOMMENDATIONS:   Greater than 50% of   40  min face to face time with patient was spent on counseling and coordination of care. We reviewed his sleeping issues. He self initiated old RX of Seroquel to help with sleep. I agreed to write new RX. We reviewed Genesight results today and decided on a trial of Viibryd.  He is very preoccupied on his RX of xanax. Some reluctance to try other medications. He asked for an increase to 4 mg total per day and I explained that I was not comfortable with anymore than  3 mg per day with a plan to slowly decrease over long period. Explained to pt that we have to find a non addictive medication to help with anxiety.  He is also on pain medication that is concerning to me.  Patient does have a prescription of Narcan available.  Advised patient to not take Xanax at same time as pain medication.  Planning will involve patient being willing to wean down on his current Xanax dose.   We agreed today to:   To start Viibryd 10 mg for 7 days, then 20 mg daily Will switch Xanax 1 mg three times daily as needed. Will not increase from this dose.  To continue with Seroquel 100 mg at bedtime for sleep Will continue BuSpar 15 mg 3 times daily Will report worsening symptoms or side effects promptly Provided emergency contact information To follow-up in 6  weeks to reassess Discussed potential benefits, risk, and side effects of benzodiazepines to include potential risk of tolerance and dependence, as well as possible drowsiness.  Advised patient not to drive if experiencing drowsiness and to take lowest possible effective dose to minimize risk of dependence and tolerance.  Discussed potential metabolic side effects associated with atypical antipsychotics, as well as potential risk for movement side effects. Advised pt to contact office if movement side effects occur.   Reviewed PDMP    Joan Flores, NP

## 2023-08-04 NOTE — Telephone Encounter (Signed)
Given to Traci to dispose of.

## 2023-08-06 ENCOUNTER — Encounter: Payer: Self-pay | Admitting: Behavioral Health

## 2023-08-10 ENCOUNTER — Encounter: Payer: Self-pay | Admitting: Behavioral Health

## 2023-08-13 ENCOUNTER — Other Ambulatory Visit: Payer: Self-pay | Admitting: Behavioral Health

## 2023-08-13 DIAGNOSIS — F422 Mixed obsessional thoughts and acts: Secondary | ICD-10-CM

## 2023-08-13 DIAGNOSIS — F5105 Insomnia due to other mental disorder: Secondary | ICD-10-CM

## 2023-08-13 DIAGNOSIS — F411 Generalized anxiety disorder: Secondary | ICD-10-CM

## 2023-08-26 ENCOUNTER — Other Ambulatory Visit: Payer: Self-pay | Admitting: Behavioral Health

## 2023-08-26 DIAGNOSIS — F411 Generalized anxiety disorder: Secondary | ICD-10-CM

## 2023-09-01 ENCOUNTER — Other Ambulatory Visit: Payer: Self-pay | Admitting: Behavioral Health

## 2023-09-01 DIAGNOSIS — F422 Mixed obsessional thoughts and acts: Secondary | ICD-10-CM

## 2023-09-01 DIAGNOSIS — F411 Generalized anxiety disorder: Secondary | ICD-10-CM

## 2023-09-01 DIAGNOSIS — F410 Panic disorder [episodic paroxysmal anxiety] without agoraphobia: Secondary | ICD-10-CM

## 2023-09-11 ENCOUNTER — Other Ambulatory Visit: Payer: Self-pay | Admitting: Behavioral Health

## 2023-09-11 DIAGNOSIS — F422 Mixed obsessional thoughts and acts: Secondary | ICD-10-CM

## 2023-09-11 DIAGNOSIS — F5105 Insomnia due to other mental disorder: Secondary | ICD-10-CM

## 2023-09-11 DIAGNOSIS — F411 Generalized anxiety disorder: Secondary | ICD-10-CM

## 2023-09-16 ENCOUNTER — Ambulatory Visit: Payer: 59 | Admitting: Behavioral Health

## 2023-09-24 ENCOUNTER — Ambulatory Visit: Payer: 59 | Admitting: Behavioral Health

## 2023-09-26 ENCOUNTER — Other Ambulatory Visit: Payer: Self-pay | Admitting: Behavioral Health

## 2023-09-26 DIAGNOSIS — F410 Panic disorder [episodic paroxysmal anxiety] without agoraphobia: Secondary | ICD-10-CM

## 2023-09-26 DIAGNOSIS — F422 Mixed obsessional thoughts and acts: Secondary | ICD-10-CM

## 2023-09-26 DIAGNOSIS — F411 Generalized anxiety disorder: Secondary | ICD-10-CM

## 2023-09-26 NOTE — Telephone Encounter (Signed)
Has appt 1/17.

## 2023-09-30 ENCOUNTER — Encounter: Payer: Self-pay | Admitting: Behavioral Health

## 2023-09-30 ENCOUNTER — Ambulatory Visit: Payer: 59 | Admitting: Behavioral Health

## 2023-09-30 ENCOUNTER — Other Ambulatory Visit: Payer: Self-pay | Admitting: Behavioral Health

## 2023-09-30 DIAGNOSIS — F410 Panic disorder [episodic paroxysmal anxiety] without agoraphobia: Secondary | ICD-10-CM

## 2023-09-30 DIAGNOSIS — F99 Mental disorder, not otherwise specified: Secondary | ICD-10-CM

## 2023-09-30 DIAGNOSIS — F5105 Insomnia due to other mental disorder: Secondary | ICD-10-CM | POA: Diagnosis not present

## 2023-09-30 DIAGNOSIS — F422 Mixed obsessional thoughts and acts: Secondary | ICD-10-CM

## 2023-09-30 DIAGNOSIS — F411 Generalized anxiety disorder: Secondary | ICD-10-CM

## 2023-09-30 MED ORDER — QUETIAPINE FUMARATE 100 MG PO TABS
ORAL_TABLET | ORAL | 0 refills | Status: DC
Start: 2023-09-30 — End: 2023-11-02

## 2023-09-30 MED ORDER — VILAZODONE HCL 40 MG PO TABS
40.0000 mg | ORAL_TABLET | Freq: Every day | ORAL | 1 refills | Status: DC
Start: 2023-09-30 — End: 2023-10-11

## 2023-09-30 NOTE — Telephone Encounter (Signed)
Has appt today

## 2023-09-30 NOTE — Telephone Encounter (Signed)
Please send was here today for visit

## 2023-09-30 NOTE — Progress Notes (Signed)
[facility]  Patient ID: Douglas Gonzalez,  MRN: [id number]  PCP: Loyal Jacobson, MD  Date of Evaluation: 09/30/2023 Time spent:30 minutes  Chief Complaint:  Chief Complaint   Anxiety; Depression; Follow-up; Patient Education; Medication Refill     HISTORY/CURRENT STATUS: HPI "Douglas Gonzalez", 42 year old male presents to this office for initial visit and to establish care.  Patient still says he is having problems with anxiety and panic attacks.  He also is currently taking a higher dose of Xanax which was previously prescribed. Burning through xanax faster than he should and now believes the extended release in not working well enough. He regularly uses opioid pain medication for chronic back pain. He wanted to review GeneSight results today to consider other medication options.  Would like to discuss sleeping medications because he is still struggling.   He says his anxiety today is 5/10, and depression is 3/10.  Concerned about his lack of quality sleep and reports only getting about 4-5 hours per night after he self initiated Seroquel again.  He would like to come back in two weeks to discuss AD'S.  He denies any current mania, no history of psychosis, no auditory or visual hallucinations.  He denies SI or HI.  He verbally contracts for safety with this Clinical research associate.   Past psychiatric medication trials: Zoloft Effexor Rexulti BuSpar Xanax Klonopin Celexa Lexapro Paxil Wellbutrin Seroquel Abilify Lunesta Lamictal Ativan Propranolol Hydroxyzine Gabapentin Vraylar BuSpar       Individual Medical History/ Review of Systems: Changes? :No   Allergies: Neosporin [bacitracin-polymyxin b]  Current Medications:  Current Outpatient Medications:    Vilazodone HCl (VIIBRYD) 40 MG TABS, Take 1 tablet (40 mg total) by mouth daily., Disp: 30 tablet, Rfl: 1   ALPRAZolam (XANAX) 1 MG tablet, TAKE 1 TABLET BY MOUTH 3 TIMES DAILY AS NEEDED FOR ANXIETY., Disp: 90 tablet, Rfl: 0    busPIRone (BUSPAR) 15 MG tablet, TAKE 1 TABLET BY MOUTH 3 TIMES DAILY., Disp: 270 tablet, Rfl: 0   cyclobenzaprine (FLEXERIL) 10 MG tablet, Take by mouth., Disp: , Rfl:    fluticasone (FLONASE) 50 MCG/ACT nasal spray, Place into the nose., Disp: , Rfl:    montelukast (SINGULAIR) 10 MG tablet, Take 10 mg by mouth daily., Disp: , Rfl:    naloxone (NARCAN) nasal spray 4 mg/0.1 mL, Place into the nose., Disp: , Rfl:    QUEtiapine (SEROQUEL) 100 MG tablet, Take 1 tablet by mouth at bedtime daily for insomnia and anxiety., Disp: 30 tablet, Rfl: 0   Vilazodone HCl 20 MG TABS, Take 1/2 tablet for 7 days, then take one whole tablet daily. Must take with food., Disp: 30 tablet, Rfl: 1 Medication Side Effects: none  Family Medical/ Social History: Changes? No  MENTAL HEALTH EXAM:  There were no vitals taken for this visit.There is no height or weight on file to calculate BMI.  General Appearance: Casual, Neat, and Well Groomed  Eye Contact:  Good  Speech:  Clear and Coherent  Volume:  Normal  Mood:  NA  Affect:  Appropriate  Thought Process:  Coherent  Orientation:  Full (Time, Place, and Person)  Thought Content: Logical   Suicidal Thoughts:  No  Homicidal Thoughts:  No  Memory:  WNL  Judgement:  Good  Insight:  Good  Psychomotor Activity:  Normal  Concentration:  Concentration: Good  Recall:  Good  Fund of Knowledge: Good  Language: Good  Assets:  Desire for Improvement  ADL's:  Intact  Cognition: WNL  Prognosis:  Good  DIAGNOSES:    ICD-10-CM   1. Generalized anxiety disorder  F41.1 Vilazodone HCl (VIIBRYD) 40 MG TABS    QUEtiapine (SEROQUEL) 100 MG tablet    2. Mixed obsessional thoughts and acts  F42.2 Vilazodone HCl (VIIBRYD) 40 MG TABS    QUEtiapine (SEROQUEL) 100 MG tablet    3. Panic attack  F41.0 Vilazodone HCl (VIIBRYD) 40 MG TABS    4. Insomnia due to other mental disorder  F51.05 QUEtiapine (SEROQUEL) 100 MG tablet   F99       Receiving Psychotherapy: No     RECOMMENDATIONS:  Greater than 50% of  30  min face to face time with patient was spent on counseling and coordination of care. Sleep has improved since last visit. He is calm today.  He is very preoccupied on his RX of xanax, but says he is willing to wean off if he can get more of his anxiety under control. We talked about increasing his Viibryd today to see if it would help a little more. He is report about 25% improvement so far. Some reluctance to try other medications.  We will continue on Xanax 3 mg a little longer until  we make more improvement and then will try to wean down on the xanax.  Patient does have a prescription of Narcan available since he also ha been on long term opioid pain medication for chronic back problems. Advised patient to not take Xanax at same time as pain medication.  We agreed today to:   To increase Viibryd to 40 mg for 7 days, then 20 mg daily Will switch Xanax 1 mg three times daily as needed. Will not increase from this dose.  To continue with Seroquel 100 mg at bedtime for sleep Will continue BuSpar 15 mg 3 times daily Will report worsening symptoms or side effects promptly Provided emergency contact information To follow-up in 6  weeks to reassess Discussed potential benefits, risk, and side effects of benzodiazepines to include potential risk of tolerance and dependence, as well as possible drowsiness.  Advised patient not to drive if experiencing drowsiness and to take lowest possible effective dose to minimize risk of dependence and tolerance.  Discussed potential metabolic side effects associated with atypical antipsychotics, as well as potential risk for movement side effects. Advised pt to contact office if movement side effects occur.   Reviewed PDMP     Joan Flores, NP

## 2023-10-06 ENCOUNTER — Other Ambulatory Visit: Payer: Self-pay | Admitting: Behavioral Health

## 2023-10-06 DIAGNOSIS — F411 Generalized anxiety disorder: Secondary | ICD-10-CM

## 2023-10-06 DIAGNOSIS — F5105 Insomnia due to other mental disorder: Secondary | ICD-10-CM

## 2023-10-06 DIAGNOSIS — F422 Mixed obsessional thoughts and acts: Secondary | ICD-10-CM

## 2023-10-11 ENCOUNTER — Other Ambulatory Visit: Payer: Self-pay

## 2023-10-11 ENCOUNTER — Telehealth: Payer: Self-pay | Admitting: Behavioral Health

## 2023-10-11 DIAGNOSIS — F422 Mixed obsessional thoughts and acts: Secondary | ICD-10-CM

## 2023-10-11 DIAGNOSIS — F410 Panic disorder [episodic paroxysmal anxiety] without agoraphobia: Secondary | ICD-10-CM

## 2023-10-11 DIAGNOSIS — F411 Generalized anxiety disorder: Secondary | ICD-10-CM

## 2023-10-11 MED ORDER — VILAZODONE HCL 40 MG PO TABS: 40.0000 mg | ORAL_TABLET | Freq: Every day | ORAL | 0 refills | Status: DC

## 2023-10-11 MED ORDER — ALPRAZOLAM 1 MG PO TABS: 1.0000 mg | ORAL_TABLET | Freq: Three times a day (TID) | ORAL | 0 refills | Status: DC | PRN

## 2023-10-11 NOTE — Telephone Encounter (Signed)
Douglas Gonzalez called to request refills of all his medications. - Viibryd, xanax, and buspar.  His medication was knocked over.  He was able to salvage some but will need refills. The pharmacy said they could fill the Buspar.  He apparently didn't get a 90 supply when last filled.  The other two he just picked up 2 weeks ago, so they will be approval for early refills.  He knows he will have to pay out of pocket.  Next appt 12/9.  Send to  [id number] - [place], [place] - [address]

## 2023-10-11 NOTE — Telephone Encounter (Signed)
Viibryd 40 mg RF sent. Pt said he had 11 Xanax left, takes 3 daily. Will pend.

## 2023-10-23 ENCOUNTER — Other Ambulatory Visit: Payer: Self-pay | Admitting: Behavioral Health

## 2023-10-23 DIAGNOSIS — F422 Mixed obsessional thoughts and acts: Secondary | ICD-10-CM

## 2023-10-23 DIAGNOSIS — F410 Panic disorder [episodic paroxysmal anxiety] without agoraphobia: Secondary | ICD-10-CM

## 2023-10-23 DIAGNOSIS — F411 Generalized anxiety disorder: Secondary | ICD-10-CM

## 2023-11-01 ENCOUNTER — Ambulatory Visit: Payer: 59 | Admitting: Behavioral Health

## 2023-11-01 ENCOUNTER — Encounter: Payer: Self-pay | Admitting: Behavioral Health

## 2023-11-01 DIAGNOSIS — F5105 Insomnia due to other mental disorder: Secondary | ICD-10-CM | POA: Diagnosis not present

## 2023-11-01 DIAGNOSIS — F410 Panic disorder [episodic paroxysmal anxiety] without agoraphobia: Secondary | ICD-10-CM

## 2023-11-01 DIAGNOSIS — F411 Generalized anxiety disorder: Secondary | ICD-10-CM | POA: Diagnosis not present

## 2023-11-01 DIAGNOSIS — F99 Mental disorder, not otherwise specified: Secondary | ICD-10-CM

## 2023-11-01 DIAGNOSIS — F422 Mixed obsessional thoughts and acts: Secondary | ICD-10-CM | POA: Diagnosis not present

## 2023-11-01 DIAGNOSIS — F330 Major depressive disorder, recurrent, mild: Secondary | ICD-10-CM

## 2023-11-01 NOTE — Progress Notes (Signed)
[facility]  Patient ID: Douglas Gonzalez,  MRN: [id number]  PCP: Loyal Jacobson, MD  Date of Evaluation: 11/01/2023 Time spent:30 minutes  Chief Complaint:  Chief Complaint   Anxiety; Depression; Follow-up; Patient Education; Medication Refill     HISTORY/CURRENT STATUS: HPI  "Douglas Gonzalez", 42 year old male presents to this office for initial visit and to establish care. Says that since we went up on the Viibryd to 40 mg he is starting to notice some changes. Says his wife also sees the improvement. Anxiety has decreased moderately, and less tendency to panic.  Seeing some improvement in OCD symptoms.  He says his anxiety today is 3/10, and depression is 3/10.  Sleep has improved with Seroquel and getting about 7 hours per night.  Requesting no med changes this visit.  He denies any current mania, no history of psychosis, no auditory or visual hallucinations.  He denies SI or HI.  He verbally contracts for safety with this Clinical research associate.   Past psychiatric medication trials: Zoloft Effexor Rexulti BuSpar Xanax Klonopin Celexa Lexapro Paxil Wellbutrin Seroquel Abilify Lunesta Lamictal Ativan Propranolol Hydroxyzine Gabapentin Vraylar BuSpar    Individual Medical History/ Review of Systems: Changes? :No   Allergies: Neosporin [bacitracin-polymyxin b]  Current Medications:  Current Outpatient Medications:    ALPRAZolam (XANAX) 1 MG tablet, TAKE 1 TABLET BY MOUTH 3 TIMES DAILY AS NEEDED FOR ANXIETY., Disp: 90 tablet, Rfl: 0   busPIRone (BUSPAR) 15 MG tablet, TAKE 1 TABLET BY MOUTH 3 TIMES DAILY., Disp: 270 tablet, Rfl: 0   cyclobenzaprine (FLEXERIL) 10 MG tablet, Take by mouth., Disp: , Rfl:    fluticasone (FLONASE) 50 MCG/ACT nasal spray, Place into the nose., Disp: , Rfl:    montelukast (SINGULAIR) 10 MG tablet, Take 10 mg by mouth daily., Disp: , Rfl:    naloxone (NARCAN) nasal spray 4 mg/0.1 mL, Place into the nose., Disp: , Rfl:    QUEtiapine (SEROQUEL) 100 MG  tablet, Take 1 tablet by mouth at bedtime daily for insomnia and anxiety., Disp: 30 tablet, Rfl: 0   Vilazodone HCl (VIIBRYD) 40 MG TABS, Take 1 tablet (40 mg total) by mouth daily., Disp: 30 tablet, Rfl: 0   Vilazodone HCl 20 MG TABS, Take 1/2 tablet for 7 days, then take one whole tablet daily. Must take with food., Disp: 30 tablet, Rfl: 1 Medication Side Effects: none  Family Medical/ Social History: Changes? No  MENTAL HEALTH EXAM:  There were no vitals taken for this visit.There is no height or weight on file to calculate BMI.  General Appearance: Casual  Eye Contact:  Good  Speech:  Clear and Coherent and Talkative  Volume:  Normal  Mood:  Anxious and Depressed  Affect:  Appropriate  Thought Process:  Coherent  Orientation:  Full (Time, Place, and Person)  Thought Content: Logical   Suicidal Thoughts:  No  Homicidal Thoughts:  No  Memory:  WNL  Judgement:  Good  Insight:  Good  Psychomotor Activity:  Normal  Concentration:  Concentration: Good  Recall:  Good  Fund of Knowledge: Good  Language: Good  Assets:  Desire for Improvement  ADL's:  Intact  Cognition: WNL  Prognosis:  Good    DIAGNOSES: No diagnosis found.  Receiving Psychotherapy: No    RECOMMENDATIONS:   RECOMMENDATIONS:   Greater than 50% of  30  min face to face time with patient was spent on counseling and coordination of care. Sleep has improved since last visit. He is calm today. Talked about his mild improvement  with anxiety, panic and OCD. We will continue on Xanax 3 mg a little longer until  we make more improvement and then will try to wean down on the xanax.  Patient does have a prescription of Narcan available since he also ha been on long term opioid pain medication for chronic back problems. Advised patient to not take Xanax at same time as pain medication.  We agreed today to:   To continue  Viibryd 40 mg daily. Must take with food. To continue  Xanax 1 mg three times daily as needed. Will  not increase from this dose.  To continue with Seroquel 100 mg at bedtime for sleep Will continue BuSpar 15 mg 3 times daily Will report worsening symptoms or side effects promptly Provided emergency contact information To follow-up in 4 weeks to reassess Discussed potential benefits, risk, and side effects of benzodiazepines to include potential risk of tolerance and dependence, as well as possible drowsiness.  Advised patient not to drive if experiencing drowsiness and to take lowest possible effective dose to minimize risk of dependence and tolerance.  Discussed potential metabolic side effects associated with atypical antipsychotics, as well as potential risk for movement side effects. Advised pt to contact office if movement side effects occur.   Reviewed PDMP    Joan Flores, NP

## 2023-11-01 NOTE — Progress Notes (Deleted)
[facility]  Patient ID: Douglas Gonzalez,  MRN: [id number]  PCP: Loyal Jacobson, MD  Date of Evaluation: 11/01/2023 Time spent:30 minutes  Chief Complaint:   HISTORY/CURRENT STATUS: HPI  Individual Medical History/ Review of Systems: Changes? :No   Allergies: Neosporin [bacitracin-polymyxin b]  Current Medications:  Current Outpatient Medications:    ALPRAZolam (XANAX) 1 MG tablet, TAKE 1 TABLET BY MOUTH 3 TIMES DAILY AS NEEDED FOR ANXIETY., Disp: 90 tablet, Rfl: 0   busPIRone (BUSPAR) 15 MG tablet, TAKE 1 TABLET BY MOUTH 3 TIMES DAILY., Disp: 270 tablet, Rfl: 0   cyclobenzaprine (FLEXERIL) 10 MG tablet, Take by mouth., Disp: , Rfl:    fluticasone (FLONASE) 50 MCG/ACT nasal spray, Place into the nose., Disp: , Rfl:    montelukast (SINGULAIR) 10 MG tablet, Take 10 mg by mouth daily., Disp: , Rfl:    naloxone (NARCAN) nasal spray 4 mg/0.1 mL, Place into the nose., Disp: , Rfl:    QUEtiapine (SEROQUEL) 100 MG tablet, Take 1 tablet by mouth at bedtime daily for insomnia and anxiety., Disp: 30 tablet, Rfl: 0   Vilazodone HCl (VIIBRYD) 40 MG TABS, Take 1 tablet (40 mg total) by mouth daily., Disp: 30 tablet, Rfl: 0   Vilazodone HCl 20 MG TABS, Take 1/2 tablet for 7 days, then take one whole tablet daily. Must take with food., Disp: 30 tablet, Rfl: 1 Medication Side Effects: none  Family Medical/ Social History: Changes? No  MENTAL HEALTH EXAM:  There were no vitals taken for this visit.There is no height or weight on file to calculate BMI.  General Appearance: Casual and Neat  Eye Contact:  Good  Speech:  Clear and Coherent  Volume:  Normal  Mood:  NA  Affect:  Appropriate  Thought Process:  Coherent  Orientation:  Full (Time, Place, and Person)  Thought Content: Logical   Suicidal Thoughts:  No  Homicidal Thoughts:  No  Memory:  WNL  Judgement:  Good  Insight:  Good  Psychomotor Activity:  Normal  Concentration:  Concentration: Good  Recall:  Good  Fund  of Knowledge: Good  Language: Good  Assets:  Desire for Improvement  ADL's:  Intact  Cognition: WNL  Prognosis:  Good    DIAGNOSES: No diagnosis found.  Receiving Psychotherapy: No    RECOMMENDATIONS:        Joan Flores, NP

## 2023-11-02 ENCOUNTER — Other Ambulatory Visit: Payer: Self-pay | Admitting: Behavioral Health

## 2023-11-02 DIAGNOSIS — F5105 Insomnia due to other mental disorder: Secondary | ICD-10-CM

## 2023-11-02 DIAGNOSIS — F411 Generalized anxiety disorder: Secondary | ICD-10-CM

## 2023-11-02 DIAGNOSIS — F422 Mixed obsessional thoughts and acts: Secondary | ICD-10-CM

## 2023-11-08 ENCOUNTER — Telehealth: Payer: Self-pay | Admitting: Behavioral Health

## 2023-11-08 NOTE — Telephone Encounter (Signed)
He doesn't need it until 12/24.  He is leaving on the 26th, but pharmacy may not be open on the 25th.

## 2023-11-08 NOTE — Telephone Encounter (Signed)
Douglas Gonzalez called to request refill of his Xanax. Appt 11/29/23.  He is travelling on business.  Leaving on the 26th and he will be gone when it is time to fill his medication.  This request is for an early refill due to this travel, but he needs to have his medication with him for this trip.  Please send in prescription refill with approval for early refill due to travel or vacation.  Send to  [id number] - [place], [place] - [address]

## 2023-11-08 NOTE — Telephone Encounter (Signed)
LF 12/2, due 12/30

## 2023-11-09 NOTE — Telephone Encounter (Signed)
Discussed with Arlys John. Can send Rx to a pharmacy where he is going. LVM to [facility].

## 2023-11-09 NOTE — Telephone Encounter (Signed)
Patient returned call. He asks that Rx be sent for fill 12/30 to local pharmacy and his wife will pick up and mail to him.

## 2023-11-20 ENCOUNTER — Other Ambulatory Visit: Payer: Self-pay

## 2023-11-20 DIAGNOSIS — F411 Generalized anxiety disorder: Secondary | ICD-10-CM

## 2023-11-20 DIAGNOSIS — F410 Panic disorder [episodic paroxysmal anxiety] without agoraphobia: Secondary | ICD-10-CM

## 2023-11-20 DIAGNOSIS — F422 Mixed obsessional thoughts and acts: Secondary | ICD-10-CM

## 2023-11-21 ENCOUNTER — Other Ambulatory Visit: Payer: Self-pay | Admitting: Behavioral Health

## 2023-11-21 DIAGNOSIS — F410 Panic disorder [episodic paroxysmal anxiety] without agoraphobia: Secondary | ICD-10-CM

## 2023-11-21 DIAGNOSIS — F422 Mixed obsessional thoughts and acts: Secondary | ICD-10-CM

## 2023-11-21 DIAGNOSIS — F411 Generalized anxiety disorder: Secondary | ICD-10-CM

## 2023-11-22 MED ORDER — ALPRAZOLAM 1 MG PO TABS: 1.0000 mg | ORAL_TABLET | Freq: Three times a day (TID) | ORAL | 0 refills | Status: DC | PRN

## 2023-11-22 NOTE — Telephone Encounter (Signed)
Pended RF.  

## 2023-11-29 ENCOUNTER — Ambulatory Visit: Payer: 59 | Admitting: Behavioral Health

## 2023-11-29 ENCOUNTER — Encounter: Payer: Self-pay | Admitting: Behavioral Health

## 2023-11-29 DIAGNOSIS — F422 Mixed obsessional thoughts and acts: Secondary | ICD-10-CM

## 2023-11-29 DIAGNOSIS — F5105 Insomnia due to other mental disorder: Secondary | ICD-10-CM | POA: Diagnosis not present

## 2023-11-29 DIAGNOSIS — F99 Mental disorder, not otherwise specified: Secondary | ICD-10-CM

## 2023-11-29 DIAGNOSIS — F411 Generalized anxiety disorder: Secondary | ICD-10-CM | POA: Diagnosis not present

## 2023-11-29 DIAGNOSIS — F410 Panic disorder [episodic paroxysmal anxiety] without agoraphobia: Secondary | ICD-10-CM

## 2023-11-29 MED ORDER — VILAZODONE HCL 40 MG PO TABS: 40.0000 mg | ORAL_TABLET | Freq: Every day | ORAL | 1 refills | Status: DC

## 2023-11-29 MED ORDER — PROPRANOLOL HCL 10 MG PO TABS: ORAL_TABLET | ORAL | 1 refills | Status: DC

## 2023-11-29 MED ORDER — ALPRAZOLAM 1 MG PO TABS: 1.0000 mg | ORAL_TABLET | Freq: Three times a day (TID) | ORAL | 2 refills | Status: DC | PRN

## 2023-11-29 MED ORDER — QUETIAPINE FUMARATE 100 MG PO TABS: ORAL_TABLET | ORAL | 1 refills | Status: DC

## 2023-11-29 MED ORDER — BUSPIRONE HCL 15 MG PO TABS: 15.0000 mg | ORAL_TABLET | Freq: Three times a day (TID) | ORAL | 1 refills | Status: DC

## 2023-11-29 NOTE — Progress Notes (Signed)
 [facility]  Patient ID: Douglas Gonzalez,  MRN: [id number]  PCP: Millicent Sharper, MD  Date of Evaluation: 11/29/2023 Time spent:30 minutes  Chief Complaint:  Chief Complaint   Anxiety; Depression; Follow-up; Patient Education; Medication Refill     HISTORY/CURRENT STATUS: HPI  Douglas Gonzalez, 43 year old male presents to this office for initial visit and to establish care. Says he is doing pretty good right now. Feels stabile. Has some anxiety and excitement about his new job.  Anxiety has decreased moderately, and less tendency to panic.  Seeing some improvement in OCD symptoms.  He says his anxiety today is 3/10, and depression is 3/10.  Sleep has improved with Seroquel  and getting about 7 hours per night.  Requesting no med changes this visit, and will not consider any further until he is situated in new job.  He denies any current mania, no history of psychosis, no auditory or visual hallucinations.  He denies SI or HI.  He verbally contracts for safety with this clinical research associate.   Past psychiatric medication trials: Zoloft Effexor Rexulti BuSpar  Xanax  Klonopin Celexa Lexapro Paxil Wellbutrin Seroquel  Abilify Lunesta Lamictal Ativan Propranolol  Hydroxyzine Gabapentin Vraylar BuSpar      Individual Medical History/ Review of Systems: Changes? :No   Allergies: Neosporin [bacitracin-polymyxin b]  Current Medications:  Current Outpatient Medications:    propranolol  (INDERAL ) 10 MG tablet, Take 1-2 tabs po BID prn anxiety, Disp: 120 tablet, Rfl: 1   ALPRAZolam  (XANAX ) 1 MG tablet, Take 1 tablet (1 mg total) by mouth 3 (three) times daily as needed for anxiety., Disp: 90 tablet, Rfl: 2   busPIRone  (BUSPAR ) 15 MG tablet, Take 1 tablet (15 mg total) by mouth 3 (three) times daily., Disp: 270 tablet, Rfl: 1   cyclobenzaprine (FLEXERIL) 10 MG tablet, Take by mouth., Disp: , Rfl:    fluticasone (FLONASE) 50 MCG/ACT nasal spray, Place into the nose., Disp: , Rfl:     montelukast (SINGULAIR) 10 MG tablet, Take 10 mg by mouth daily., Disp: , Rfl:    naloxone (NARCAN) nasal spray 4 mg/0.1 mL, Place into the nose., Disp: , Rfl:    QUEtiapine  (SEROQUEL ) 100 MG tablet, Take 1 tablet by mouth at bedtime daily for insomnia and anxiety., Disp: 90 tablet, Rfl: 1   Vilazodone  HCl (VIIBRYD ) 40 MG TABS, Take 1 tablet (40 mg total) by mouth daily., Disp: 90 tablet, Rfl: 1   Vilazodone  HCl 20 MG TABS, Take 1/2 tablet for 7 days, then take one whole tablet daily. Must take with food., Disp: 30 tablet, Rfl: 1 Medication Side Effects: none  Family Medical/ Social History: Changes? No  MENTAL HEALTH EXAM:  There were no vitals taken for this visit.There is no height or weight on file to calculate BMI.  General Appearance: Casual  Eye Contact:  Good  Speech:  Clear and Coherent  Volume:  Normal  Mood:  Anxious and Depressed  Affect:  Appropriate  Thought Process:  Coherent  Orientation:  Full (Time, Place, and Person)  Thought Content: Logical   Suicidal Thoughts:  No  Homicidal Thoughts:  No  Memory:  WNL  Judgement:  Good  Insight:  Good  Psychomotor Activity:  Normal  Concentration:  Concentration: Good  Recall:  Good  Fund of Knowledge: Good  Language: Good  Assets:  Desire for Improvement  ADL's:  Intact  Cognition: WNL  Prognosis:  Good    DIAGNOSES:    ICD-10-CM   1. Generalized anxiety disorder  F41.1 ALPRAZolam  (XANAX ) 1 MG tablet  busPIRone  (BUSPAR ) 15 MG tablet    Vilazodone  HCl (VIIBRYD ) 40 MG TABS    QUEtiapine  (SEROQUEL ) 100 MG tablet    propranolol  (INDERAL ) 10 MG tablet    2. Mixed obsessional thoughts and acts  F42.2 ALPRAZolam  (XANAX ) 1 MG tablet    Vilazodone  HCl (VIIBRYD ) 40 MG TABS    QUEtiapine  (SEROQUEL ) 100 MG tablet    3. Panic attack  F41.0 ALPRAZolam  (XANAX ) 1 MG tablet    Vilazodone  HCl (VIIBRYD ) 40 MG TABS    propranolol  (INDERAL ) 10 MG tablet    4. Insomnia due to other mental disorder  F51.05 QUEtiapine  (SEROQUEL )  100 MG tablet   F99       Receiving Psychotherapy: No    RECOMMENDATIONS:    Greater than 50% of  30  min face to face time with patient was spent on counseling and coordination of care. Sleep has improved since last visit. He is calm today, not as fixated on medication. Talked about his mild improvement with anxiety, panic and OCD. We will continue on Xanax  3 mg a little longer until  we make more improvement and then will try to wean down on the xanax .  Pt will be starting new job and will not make any further changes until he is established. Patient does have a prescription of Narcan available since he also ha been on long term opioid pain medication for chronic back problems. Advised patient to not take Xanax  at same time as pain medication.  We agreed today to:   To continue  Viibryd  40 mg daily. Must take with food. To continue  Xanax  1 mg three times daily as needed. Will not increase from this dose.  To start Inderal  10 mg 1-2 tablets twice daily as needed for anxiety To continue with Seroquel  100 mg at bedtime for sleep Will continue BuSpar  15 mg 3 times daily Will report worsening symptoms or side effects promptly Provided emergency contact information To follow-up in 4 weeks to reassess Discussed potential benefits, risk, and side effects of benzodiazepines to include potential risk of tolerance and dependence, as well as possible drowsiness.  Advised patient not to drive if experiencing drowsiness and to take lowest possible effective dose to minimize risk of dependence and tolerance.  Discussed potential metabolic side effects associated with atypical antipsychotics, as well as potential risk for movement side effects. Advised pt to contact office if movement side effects occur.   Reviewed PDMP       Redell DELENA Pizza, NP

## 2023-12-16 ENCOUNTER — Telehealth: Payer: Self-pay | Admitting: Behavioral Health

## 2023-12-16 NOTE — Telephone Encounter (Signed)
Pt called at 11:48a.  He is asking for Alprazolam refill a day earlier than it is due because he is going out of town on a trip.  He says his bottle shows last filled on Dec 30, which means he should be able to get it Jan 27.  He is asking for it on Jan 26.  I told him the date I saw was Jan 6.  So I'm not sure what is what.  He said he confirmed pharmacy has medication, but they told him to contact us for the early refill.   [id number] - [place], [place] - [address] [place], [place] [address] Phone: [phone]  Fax: [phone]    Next appt 3/6

## 2023-12-16 NOTE — Telephone Encounter (Signed)
No early RF.   

## 2023-12-17 NOTE — Telephone Encounter (Signed)
Patient notified no early RF.

## 2023-12-23 ENCOUNTER — Other Ambulatory Visit: Payer: Self-pay | Admitting: Behavioral Health

## 2023-12-23 DIAGNOSIS — F411 Generalized anxiety disorder: Secondary | ICD-10-CM

## 2023-12-23 DIAGNOSIS — F410 Panic disorder [episodic paroxysmal anxiety] without agoraphobia: Secondary | ICD-10-CM

## 2024-01-17 ENCOUNTER — Telehealth: Payer: Self-pay | Admitting: Behavioral Health

## 2024-01-17 NOTE — Telephone Encounter (Signed)
 Elon Jester, Pharmacist at [facility], LVM @ 8:51a stating that she sent pt away froom the pharmacy today because he has received 27 days extra of Xanax in the last 4 months (Since Oct 11).  She said she will not refill his med until Wed when it is due.  She wanted [place] to be aware.  Next appt 3/6

## 2024-01-17 NOTE — Telephone Encounter (Signed)
 LF 1/27, due today based on dates, but pharmacy said he has been getting early.

## 2024-01-25 ENCOUNTER — Other Ambulatory Visit: Payer: Self-pay | Admitting: Behavioral Health

## 2024-01-25 DIAGNOSIS — F410 Panic disorder [episodic paroxysmal anxiety] without agoraphobia: Secondary | ICD-10-CM

## 2024-01-25 DIAGNOSIS — F411 Generalized anxiety disorder: Secondary | ICD-10-CM

## 2024-01-27 ENCOUNTER — Encounter: Payer: Self-pay | Admitting: Behavioral Health

## 2024-01-27 ENCOUNTER — Ambulatory Visit: Payer: 59 | Admitting: Behavioral Health

## 2024-01-27 DIAGNOSIS — F411 Generalized anxiety disorder: Secondary | ICD-10-CM | POA: Diagnosis not present

## 2024-01-27 DIAGNOSIS — F410 Panic disorder [episodic paroxysmal anxiety] without agoraphobia: Secondary | ICD-10-CM

## 2024-01-27 MED ORDER — PROPRANOLOL HCL 10 MG PO TABS: ORAL_TABLET | ORAL | 0 refills | Status: DC

## 2024-01-27 NOTE — Progress Notes (Signed)
 [facility]  Patient ID: Douglas Gonzalez,  MRN: [id number]  PCP: Loyal Jacobson, MD  Date of Evaluation: 01/27/2024 Time spent:30 minutes  Chief Complaint:  Chief Complaint   Follow-up; Depression; Medication Problem; Medication Refill; Anxiety; Patient Education     HISTORY/CURRENT STATUS: HPI  "Douglas Gonzalez", 43 year old male presents to this office for initial visit and to establish care. Says he is doing pretty good right now. Feels stabile. Says he is still in limbo with job [employer]. Does not know if it will work out. Meds are ok for now.  He says his anxiety today is 3/10, and depression is 3/10.  Sleep has improved with Seroquel and getting about 7 hours per night.  Requesting no med changes this visit, and will not consider any further until he is situated in new job.  He denies any current mania, no history of psychosis, no auditory or visual hallucinations.  He denies SI or HI.  He verbally contracts for safety with this Clinical research associate.   Past psychiatric medication trials: Zoloft Effexor Rexulti BuSpar Xanax Klonopin Celexa Lexapro Paxil Wellbutrin Seroquel Abilify Lunesta Lamictal Ativan Propranolol Hydroxyzine Gabapentin Vraylar BuSpar         Individual Medical History/ Review of Systems: Changes? :No   Allergies: Neosporin [bacitracin-polymyxin b]  Current Medications:  Current Outpatient Medications:    ALPRAZolam (XANAX) 1 MG tablet, Take 1 tablet (1 mg total) by mouth 3 (three) times daily as needed for anxiety., Disp: 90 tablet, Rfl: 2   busPIRone (BUSPAR) 15 MG tablet, Take 1 tablet (15 mg total) by mouth 3 (three) times daily., Disp: 270 tablet, Rfl: 1   cyclobenzaprine (FLEXERIL) 10 MG tablet, Take by mouth., Disp: , Rfl:    fluticasone (FLONASE) 50 MCG/ACT nasal spray, Place into the nose., Disp: , Rfl:    montelukast (SINGULAIR) 10 MG tablet, Take 10 mg by mouth daily., Disp: , Rfl:    naloxone (NARCAN) nasal spray 4 mg/0.1 mL, Place  into the nose., Disp: , Rfl:    propranolol (INDERAL) 10 MG tablet, TAKE 1 TO 2 TABLETS BY MOUTH TWICE A DAY AS NEEDED FOR ANXIETY, Disp: 120 tablet, Rfl: 0   QUEtiapine (SEROQUEL) 100 MG tablet, Take 1 tablet by mouth at bedtime daily for insomnia and anxiety., Disp: 90 tablet, Rfl: 1   Vilazodone HCl (VIIBRYD) 40 MG TABS, Take 1 tablet (40 mg total) by mouth daily., Disp: 90 tablet, Rfl: 1   Vilazodone HCl 20 MG TABS, Take 1/2 tablet for 7 days, then take one whole tablet daily. Must take with food., Disp: 30 tablet, Rfl: 1 Medication Side Effects: none  Family Medical/ Social History: Changes? No  MENTAL HEALTH EXAM:  There were no vitals taken for this visit.There is no height or weight on file to calculate BMI.  General Appearance: Casual, Neat, and Well Groomed  Eye Contact:  Good  Speech:  Clear and Coherent  Volume:  Normal  Mood:  NA  Affect:  Appropriate  Thought Process:  Coherent  Orientation:  Full (Time, Place, and Person)  Thought Content: Logical   Suicidal Thoughts:  No  Homicidal Thoughts:  No  Memory:  WNL  Judgement:  Good  Insight:  Good  Psychomotor Activity:  Normal  Concentration:  Concentration: Good  Recall:  Good  Fund of Knowledge: Good  Language: Good  Assets:  Desire for Improvement  ADL's:  Intact  Cognition: WNL  Prognosis:  Good    DIAGNOSES:    ICD-10-CM   1. Generalized anxiety  disorder  F41.1 propranolol (INDERAL) 10 MG tablet    2. Panic attack  F41.0 propranolol (INDERAL) 10 MG tablet      Receiving Psychotherapy: No    RECOMMENDATIONS:   Greater than 50% of  30  min face to face time with patient was spent on counseling and coordination of care. Sleep has improved since last visit. Did not mention refill on Xanax. I counseled him about early refills, and techniques to start training himself that xanax is not a maintenance medication. Should be used when anxiety is severe. Try relying on his propranolol when anxiety is mild.   Understands that we will eventually wean down on xanax when he obtains job. .  Patient does have a prescription of Narcan available since he also ha been on long term opioid pain medication for chronic back problems. Advised patient to not take Xanax at same time as pain medication.  We agreed today to:   To continue  Viibryd 40 mg daily. Must take with food. To continue  Xanax 1 mg three times daily as needed. Will not increase from this dose. No early fills. Reviewed note from pharmacy.  To start Inderal 10 mg 1-2 tablets twice daily as needed for anxiety To continue with Seroquel 100 mg at bedtime for sleep Will continue BuSpar 15 mg 3 times daily Will report worsening symptoms or side effects promptly Provided emergency contact information To follow-up in 8 weeks to reassess Discussed potential benefits, risk, and side effects of benzodiazepines to include potential risk of tolerance and dependence, as well as possible drowsiness.  Advised patient not to drive if experiencing drowsiness and to take lowest possible effective dose to minimize risk of dependence and tolerance.  Discussed potential metabolic side effects associated with atypical antipsychotics, as well as potential risk for movement side effects. Advised pt to contact office if movement side effects occur.   Reviewed PDMP           Joan Flores, NP

## 2024-02-17 ENCOUNTER — Other Ambulatory Visit: Payer: Self-pay | Admitting: Behavioral Health

## 2024-02-17 DIAGNOSIS — F410 Panic disorder [episodic paroxysmal anxiety] without agoraphobia: Secondary | ICD-10-CM

## 2024-02-17 DIAGNOSIS — F411 Generalized anxiety disorder: Secondary | ICD-10-CM

## 2024-02-18 ENCOUNTER — Other Ambulatory Visit: Payer: Self-pay | Admitting: Behavioral Health

## 2024-02-18 DIAGNOSIS — F422 Mixed obsessional thoughts and acts: Secondary | ICD-10-CM

## 2024-02-18 DIAGNOSIS — F411 Generalized anxiety disorder: Secondary | ICD-10-CM

## 2024-02-18 DIAGNOSIS — F5105 Insomnia due to other mental disorder: Secondary | ICD-10-CM

## 2024-02-29 ENCOUNTER — Other Ambulatory Visit: Payer: Self-pay | Admitting: Behavioral Health

## 2024-02-29 DIAGNOSIS — F422 Mixed obsessional thoughts and acts: Secondary | ICD-10-CM

## 2024-02-29 DIAGNOSIS — F410 Panic disorder [episodic paroxysmal anxiety] without agoraphobia: Secondary | ICD-10-CM

## 2024-02-29 DIAGNOSIS — F411 Generalized anxiety disorder: Secondary | ICD-10-CM

## 2024-03-14 ENCOUNTER — Telehealth: Payer: Self-pay | Admitting: Behavioral Health

## 2024-03-14 ENCOUNTER — Other Ambulatory Visit: Payer: Self-pay | Admitting: Behavioral Health

## 2024-03-14 DIAGNOSIS — F410 Panic disorder [episodic paroxysmal anxiety] without agoraphobia: Secondary | ICD-10-CM

## 2024-03-14 DIAGNOSIS — F411 Generalized anxiety disorder: Secondary | ICD-10-CM

## 2024-03-14 DIAGNOSIS — F422 Mixed obsessional thoughts and acts: Secondary | ICD-10-CM

## 2024-03-14 NOTE — Telephone Encounter (Signed)
 LF 3/26, due 4/23

## 2024-03-14 NOTE — Telephone Encounter (Signed)
 Next visit is 03/28/24. Requesting refill on Alprazolam  1 mg called to:  [id number] - [place], [place] - [address]   Phone: [phone]  Fax: [phone]

## 2024-03-15 NOTE — Telephone Encounter (Signed)
 Addressed thru pharmacy interface.

## 2024-03-28 ENCOUNTER — Encounter: Payer: Self-pay | Admitting: Behavioral Health

## 2024-03-28 ENCOUNTER — Ambulatory Visit (INDEPENDENT_AMBULATORY_CARE_PROVIDER_SITE_OTHER): Admitting: Behavioral Health

## 2024-03-28 DIAGNOSIS — F411 Generalized anxiety disorder: Secondary | ICD-10-CM

## 2024-03-28 DIAGNOSIS — F422 Mixed obsessional thoughts and acts: Secondary | ICD-10-CM | POA: Diagnosis not present

## 2024-03-28 DIAGNOSIS — F5105 Insomnia due to other mental disorder: Secondary | ICD-10-CM

## 2024-03-28 DIAGNOSIS — F410 Panic disorder [episodic paroxysmal anxiety] without agoraphobia: Secondary | ICD-10-CM | POA: Diagnosis not present

## 2024-03-28 DIAGNOSIS — F99 Mental disorder, not otherwise specified: Secondary | ICD-10-CM

## 2024-03-28 MED ORDER — QUETIAPINE FUMARATE 100 MG PO TABS: ORAL_TABLET | ORAL | 0 refills | Status: DC

## 2024-03-28 MED ORDER — BUSPIRONE HCL 15 MG PO TABS: 15.0000 mg | ORAL_TABLET | Freq: Three times a day (TID) | ORAL | 1 refills | Status: DC

## 2024-03-28 MED ORDER — ALPRAZOLAM 1 MG PO TABS: 1.0000 mg | ORAL_TABLET | Freq: Three times a day (TID) | ORAL | 1 refills | Status: DC | PRN

## 2024-03-28 MED ORDER — PROPRANOLOL HCL 20 MG PO TABS: 20.0000 mg | ORAL_TABLET | Freq: Three times a day (TID) | ORAL | 2 refills | Status: DC

## 2024-03-28 MED ORDER — VILAZODONE HCL 40 MG PO TABS: 40.0000 mg | ORAL_TABLET | Freq: Every day | ORAL | 1 refills | Status: DC

## 2024-03-28 NOTE — Progress Notes (Signed)
 [facility]  Patient ID: Maurio Coberley,  MRN: [id number]  PCP: Jayne Mews, MD  Date of Evaluation: 03/28/2024 Time spent:30 minutes  Chief Complaint:  Chief Complaint   Depression; Anxiety; Follow-up; Medication Refill; Patient Education; Panic Attack     HISTORY/CURRENT STATUS: HPI  "Kerwin", 43 year old male presents to this office for initial visit and to establish care. Says he is doing ok, but becoming very frustrated over job search. Still in state of limbo and feels like he cannot move forward.  Does not know if it will work out. Meds are ok for now.  He says his anxiety today is 3/10, and depression is 3/10.  Sleep has improved with Seroquel  and getting about 7 hours per night.  Requesting no med changes this visit, and will not consider any further until he is situated in new job.  He denies any current mania, no history of psychosis, no auditory or visual hallucinations.  He denies SI or HI.  He verbally contracts for safety with this Clinical research associate.   Past psychiatric medication trials: Zoloft Effexor Rexulti BuSpar  Xanax  Klonopin Celexa Lexapro Paxil Wellbutrin Seroquel  Abilify Lunesta Lamictal Ativan Propranolol  Hydroxyzine Gabapentin Vraylar BuSpar    Individual Medical History/ Review of Systems: Changes? :No   Allergies: Neosporin [bacitracin-polymyxin b]  Current Medications:  Current Outpatient Medications:    ALPRAZolam  (XANAX ) 1 MG tablet, TAKE 1 TABLET BY MOUTH 3 TIMES DAILY AS NEEDED FOR ANXIETY., Disp: 90 tablet, Rfl: 0   busPIRone  (BUSPAR ) 15 MG tablet, Take 1 tablet (15 mg total) by mouth 3 (three) times daily., Disp: 270 tablet, Rfl: 1   cyclobenzaprine (FLEXERIL) 10 MG tablet, Take by mouth., Disp: , Rfl:    fluticasone (FLONASE) 50 MCG/ACT nasal spray, Place into the nose., Disp: , Rfl:    montelukast (SINGULAIR) 10 MG tablet, Take 10 mg by mouth daily., Disp: , Rfl:    naloxone (NARCAN) nasal spray 4 mg/0.1 mL, Place into the  nose., Disp: , Rfl:    propranolol  (INDERAL ) 10 MG tablet, TAKE 1 TO 2 TABLETS BY MOUTH TWICE A DAY AS NEEDED FOR ANXIETY, Disp: 360 tablet, Rfl: 0   QUEtiapine  (SEROQUEL ) 100 MG tablet, TAKE 1 TABLET BY MOUTH AT BEDTIME DAILY FOR INSOMNIA AND ANXIETY., Disp: 90 tablet, Rfl: 0   Vilazodone  HCl (VIIBRYD ) 40 MG TABS, Take 1 tablet (40 mg total) by mouth daily., Disp: 90 tablet, Rfl: 1   Vilazodone  HCl 20 MG TABS, Take 1/2 tablet for 7 days, then take one whole tablet daily. Must take with food., Disp: 30 tablet, Rfl: 1 Medication Side Effects: none  Family Medical/ Social History: Changes? No  MENTAL HEALTH EXAM:  There were no vitals taken for this visit.There is no height or weight on file to calculate BMI.  General Appearance: Casual, Neat, and Well Groomed  Eye Contact:  Good  Speech:  Clear and Coherent  Volume:  Normal  Mood:  NA  Affect:  Appropriate  Thought Process:  Coherent  Orientation:  Full (Time, Place, and Person)  Thought Content: Logical   Suicidal Thoughts:  No  Homicidal Thoughts:  No  Memory:  WNL  Judgement:  Good  Insight:  Good  Psychomotor Activity:  Normal  Concentration:  Concentration: Good  Recall:  Good  Fund of Knowledge: Good  Language: Good  Assets:  Desire for Improvement  ADL's:  Intact  Cognition: WNL  Prognosis:  Good    DIAGNOSES:    ICD-10-CM   1. Generalized anxiety disorder  F41.1  2. Panic attack  F41.0       Receiving Psychotherapy: No    RECOMMENDATIONS:   Greater than 50% of  30  min face to face time with patient was spent on counseling and coordination of care. Sleep has improved since last visit. He reports having weekly bad dreams but is able to manage. Discussed increasing his propranolol . Understands that we will eventually wean down on xanax  when he obtains job. .  Patient does have a prescription of Narcan available since he also ha been on long term opioid pain medication for chronic back problems. Advised  patient to not take Xanax  at same time as pain medication.  We agreed today to:   To continue  Viibryd  40 mg daily. Must take with food. To continue  Xanax  1 mg three times daily as needed. Will not increase from this dose. No early fills. Reviewed note from pharmacy.  To continue  Inderal  to 20 mg 1-2 tablets twice daily as needed for anxiety To continue with Seroquel  100 mg at bedtime for sleep Will continue BuSpar  15 mg 3 times daily Will report worsening symptoms or side effects promptly Provided emergency contact information To follow-up in 8 weeks to reassess Discussed potential benefits, risk, and side effects of benzodiazepines to include potential risk of tolerance and dependence, as well as possible drowsiness.  Advised patient not to drive if experiencing drowsiness and to take lowest possible effective dose to minimize risk of dependence and tolerance.  Discussed potential metabolic side effects associated with atypical antipsychotics, as well as potential risk for movement side effects. Advised pt to contact office if movement side effects occur.   Reviewed PDMP      Lincoln Renshaw, NP

## 2024-05-12 ENCOUNTER — Other Ambulatory Visit: Payer: Self-pay | Admitting: Behavioral Health

## 2024-05-12 DIAGNOSIS — F411 Generalized anxiety disorder: Secondary | ICD-10-CM

## 2024-05-12 DIAGNOSIS — F410 Panic disorder [episodic paroxysmal anxiety] without agoraphobia: Secondary | ICD-10-CM

## 2024-05-29 ENCOUNTER — Telehealth: Payer: Self-pay | Admitting: Behavioral Health

## 2024-05-29 NOTE — Telephone Encounter (Signed)
 Please see message. Pt is not due for a RF until 7/17. I told him we would not authorize early RF, but could send to pharmacy in [facility]. He said he would have his mom pick up when due. Sending this for you to see the rest of his message.

## 2024-05-29 NOTE — Telephone Encounter (Signed)
 Pt called at 10:49a stating that he would like an early refill of Alprazolam  to [facility] [address], [place], [place]. They are leaving Friday and he will run out while they are in [facility] on vacation.  He's not sure if he would have a problem getting it filled in [place].  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  Next appt 8/6

## 2024-06-05 ENCOUNTER — Other Ambulatory Visit: Payer: Self-pay | Admitting: Behavioral Health

## 2024-06-05 DIAGNOSIS — F411 Generalized anxiety disorder: Secondary | ICD-10-CM

## 2024-06-07 ENCOUNTER — Other Ambulatory Visit: Payer: Self-pay | Admitting: Behavioral Health

## 2024-06-07 DIAGNOSIS — F422 Mixed obsessional thoughts and acts: Secondary | ICD-10-CM

## 2024-06-07 DIAGNOSIS — F411 Generalized anxiety disorder: Secondary | ICD-10-CM

## 2024-06-07 DIAGNOSIS — F410 Panic disorder [episodic paroxysmal anxiety] without agoraphobia: Secondary | ICD-10-CM

## 2024-06-28 ENCOUNTER — Ambulatory Visit: Admitting: Behavioral Health

## 2024-06-28 ENCOUNTER — Encounter: Payer: Self-pay | Admitting: Behavioral Health

## 2024-06-28 DIAGNOSIS — F422 Mixed obsessional thoughts and acts: Secondary | ICD-10-CM | POA: Diagnosis not present

## 2024-06-28 DIAGNOSIS — F410 Panic disorder [episodic paroxysmal anxiety] without agoraphobia: Secondary | ICD-10-CM | POA: Diagnosis not present

## 2024-06-28 DIAGNOSIS — F5105 Insomnia due to other mental disorder: Secondary | ICD-10-CM

## 2024-06-28 DIAGNOSIS — F411 Generalized anxiety disorder: Secondary | ICD-10-CM | POA: Diagnosis not present

## 2024-06-28 DIAGNOSIS — F99 Mental disorder, not otherwise specified: Secondary | ICD-10-CM

## 2024-06-28 MED ORDER — QUETIAPINE FUMARATE 100 MG PO TABS: ORAL_TABLET | ORAL | 0 refills | Status: DC

## 2024-06-28 MED ORDER — VILAZODONE HCL 40 MG PO TABS: 40.0000 mg | ORAL_TABLET | Freq: Every day | ORAL | 1 refills | Status: DC

## 2024-06-28 MED ORDER — BUSPIRONE HCL 15 MG PO TABS: 15.0000 mg | ORAL_TABLET | Freq: Three times a day (TID) | ORAL | 1 refills | Status: DC

## 2024-06-28 MED ORDER — ALPRAZOLAM 1 MG PO TABS
1.0000 mg | ORAL_TABLET | Freq: Three times a day (TID) | ORAL | 3 refills | Status: DC | PRN
Start: 2024-06-28 — End: 2024-08-29

## 2024-06-28 NOTE — Progress Notes (Signed)
 [facility]  Patient ID: Douglas Gonzalez,  MRN: [id number]  PCP: Millicent Sharper, MD  Date of Evaluation: 06/28/2024 Time spent:30 minutes  Chief Complaint:  Chief Complaint   Anxiety; Depression; Follow-up; Patient Education; Medication Refill; Stress     HISTORY/CURRENT STATUS: HPI [place], 43 year old male presents to this office for initial visit and to establish care. Says he is doing ok, but becoming very frustrated over job search. Reports job that he had high hopes for fell through. Continues with job Financial controller. Trying to stay focused and positive. Process is very stressful. Agrees to continue with current med regimen until he achieves a good period of external stability.  He says his anxiety levels continue this visit 3/10, and depression is 3/10.  Sleep is ok with continued Seroquel  and getting about 7 hours per night.  Requesting no med changes this visit, and will not consider any further until he is situated in new job.  He denies any current mania, no history of psychosis, no auditory or visual hallucinations.  He denies SI or HI.  He verbally contracts for safety with this Clinical research associate.   Past psychiatric medication trials: Zoloft Effexor Rexulti BuSpar  Xanax  Klonopin Celexa Lexapro Paxil Wellbutrin Seroquel  Abilify Lunesta Lamictal Ativan Propranolol  Hydroxyzine Gabapentin Vraylar BuSpar  Individual Medical History/ Review of Systems: Changes? :No   Allergies: Neosporin [bacitracin-polymyxin b]  Current Medications:  Current Outpatient Medications:    ALPRAZolam  (XANAX ) 1 MG tablet, TAKE 1 TABLET BY MOUTH 3 TIMES DAILY AS NEEDED FOR ANXIETY., Disp: 90 tablet, Rfl: 0   busPIRone  (BUSPAR ) 15 MG tablet, TAKE 1 TABLET BY MOUTH 3 TIMES DAILY., Disp: 90 tablet, Rfl: 1   cyclobenzaprine (FLEXERIL) 10 MG tablet, Take by mouth., Disp: , Rfl:    fluticasone (FLONASE) 50 MCG/ACT nasal spray, Place into the nose., Disp: , Rfl:    montelukast (SINGULAIR) 10 MG  tablet, Take 10 mg by mouth daily., Disp: , Rfl:    naloxone (NARCAN) nasal spray 4 mg/0.1 mL, Place into the nose., Disp: , Rfl:    propranolol  (INDERAL ) 10 MG tablet, TAKE 1 TO 2 TABLETS BY MOUTH TWICE A DAY AS NEEDED FOR ANXIETY, Disp: 120 tablet, Rfl: 2   propranolol  (INDERAL ) 20 MG tablet, Take 1 tablet (20 mg total) by mouth 3 (three) times daily., Disp: 90 tablet, Rfl: 2   QUEtiapine  (SEROQUEL ) 100 MG tablet, Take 1 tablet by mouth at bedtime daily for insomnia and anxiety., Disp: 90 tablet, Rfl: 0   Vilazodone  HCl (VIIBRYD ) 40 MG TABS, Take 1 tablet (40 mg total) by mouth daily., Disp: 90 tablet, Rfl: 1 Medication Side Effects: none  Family Medical/ Social History: Changes? No  MENTAL HEALTH EXAM:  There were no vitals taken for this visit.There is no height or weight on file to calculate BMI.  General Appearance: Casual, Neat, and Well Groomed  Eye Contact:  Good  Speech:  Clear and Coherent  Volume:  Normal  Mood:  NA  Affect:  Appropriate  Thought Process:  Coherent  Orientation:  Full (Time, Place, and Person)  Thought Content: Logical   Suicidal Thoughts:  No  Homicidal Thoughts:  No  Memory:  WNL  Judgement:  Good  Insight:  Good  Psychomotor Activity:  Normal  Concentration:  Concentration: Good  Recall:  Good  Fund of Knowledge: Good  Language: Good  Assets:  Desire for Improvement  ADL's:  Intact  Cognition: WNL  Prognosis:  Good    DIAGNOSES:    ICD-10-CM   1. Generalized  anxiety disorder  F41.1     2. Panic attack  F41.0     3. Mixed obsessional thoughts and acts  F42.2     4. Insomnia due to other mental disorder  F51.05    F99       Receiving Psychotherapy: No    RECOMMENDATIONS:   Greater than 50% of  30  min face to face time with patient was spent on counseling and coordination of care. Sleep has improved since last visit. He reports having weekly bad dreams but is able to manage. Discussed increasing his propranolol . Understands that we  will eventually wean down on xanax  when he obtains job. .  Patient does have a prescription of Narcan available since he also ha been on long term opioid pain medication for chronic back problems. Advised patient to not take Xanax  at same time as pain medication.  We agreed today to:   To continue  Viibryd  40 mg daily. Must take with food. To continue  Xanax  1 mg three times daily as needed. Will not increase from this dose. No early fills. Reviewed note from pharmacy.  To continue  Inderal  to 20 mg 1-3 tablets twice daily as needed for anxiety To continue with Seroquel  100 mg at bedtime for sleep Will continue BuSpar  15 mg 3 times daily Will report worsening symptoms or side effects promptly Provided emergency contact information To follow-up in 8 weeks to reassess Discussed potential benefits, risk, and side effects of benzodiazepines to include potential risk of tolerance and dependence, as well as possible drowsiness.  Advised patient not to drive if experiencing drowsiness and to take lowest possible effective dose to minimize risk of dependence and tolerance.  Discussed potential metabolic side effects associated with atypical antipsychotics, as well as potential risk for movement side effects. Advised pt to contact office if movement side effects occur.   Reviewed PDMP        Redell DELENA Pizza, NP

## 2024-08-24 ENCOUNTER — Other Ambulatory Visit: Payer: Self-pay | Admitting: Behavioral Health

## 2024-08-24 DIAGNOSIS — F411 Generalized anxiety disorder: Secondary | ICD-10-CM

## 2024-08-24 DIAGNOSIS — F410 Panic disorder [episodic paroxysmal anxiety] without agoraphobia: Secondary | ICD-10-CM

## 2024-08-29 ENCOUNTER — Encounter: Payer: Self-pay | Admitting: Behavioral Health

## 2024-08-29 ENCOUNTER — Ambulatory Visit (INDEPENDENT_AMBULATORY_CARE_PROVIDER_SITE_OTHER): Admitting: Behavioral Health

## 2024-08-29 DIAGNOSIS — F99 Mental disorder, not otherwise specified: Secondary | ICD-10-CM

## 2024-08-29 DIAGNOSIS — F5105 Insomnia due to other mental disorder: Secondary | ICD-10-CM | POA: Diagnosis not present

## 2024-08-29 DIAGNOSIS — F411 Generalized anxiety disorder: Secondary | ICD-10-CM | POA: Diagnosis not present

## 2024-08-29 DIAGNOSIS — F410 Panic disorder [episodic paroxysmal anxiety] without agoraphobia: Secondary | ICD-10-CM | POA: Diagnosis not present

## 2024-08-29 DIAGNOSIS — F330 Major depressive disorder, recurrent, mild: Secondary | ICD-10-CM

## 2024-08-29 DIAGNOSIS — F422 Mixed obsessional thoughts and acts: Secondary | ICD-10-CM | POA: Diagnosis not present

## 2024-08-29 MED ORDER — ALPRAZOLAM 1 MG PO TABS
1.0000 mg | ORAL_TABLET | Freq: Three times a day (TID) | ORAL | 3 refills | Status: AC | PRN
Start: 2024-08-29 — End: ?

## 2024-08-29 MED ORDER — BUSPIRONE HCL 15 MG PO TABS: 15.0000 mg | ORAL_TABLET | Freq: Three times a day (TID) | ORAL | 1 refills | Status: AC

## 2024-08-29 MED ORDER — VILAZODONE HCL 40 MG PO TABS: 40.0000 mg | ORAL_TABLET | Freq: Every day | ORAL | 1 refills | Status: AC

## 2024-08-29 MED ORDER — QUETIAPINE FUMARATE 100 MG PO TABS: ORAL_TABLET | ORAL | 0 refills | Status: AC

## 2024-08-29 NOTE — Progress Notes (Signed)
 [facility]  Patient ID: Douglas Gonzalez,  MRN: [id number]  PCP: Millicent Sharper, MD  Date of Evaluation: 08/29/2024 Time spent:30 minutes  Chief Complaint:  Chief Complaint   Anxiety; Depression; Follow-up; Patient Education; Medication Refill     HISTORY/CURRENT STATUS: HPI Douglas Gonzalez, 43 year old male presents to this office for initial visit and to establish care. Increase anxiety recently. Waiting on whether he will be selected for another job opportunity.  Sleep has been a little off. Has questions about his meds.   He says his anxiety levels continue this visit 4/10, and depression is 3/10. Understands he can take 100 mg Seroquel  if needed or utilize propranolol  in the evening.  Requesting no med changes this visit, and will not consider any further until he is situated in new job.  He denies any current mania, no history of psychosis, no auditory or visual hallucinations.  He denies SI or HI.  He verbally contracts for safety with this Clinical research associate.   Past psychiatric medication trials: Zoloft Effexor Rexulti BuSpar  Xanax  Klonopin Celexa Lexapro Paxil Wellbutrin Seroquel  Abilify Lunesta Lamictal Ativan Propranolol  Hydroxyzine Gabapentin Vraylar BuSpar        Individual Medical History/ Review of Systems: Changes? :No   Allergies: Neosporin [bacitracin-polymyxin b]  Current Medications:  Current Outpatient Medications:    ALPRAZolam  (XANAX ) 1 MG tablet, Take 1 tablet (1 mg total) by mouth 3 (three) times daily as needed for anxiety., Disp: 90 tablet, Rfl: 3   busPIRone  (BUSPAR ) 15 MG tablet, Take 1 tablet (15 mg total) by mouth 3 (three) times daily., Disp: 90 tablet, Rfl: 1   cyclobenzaprine (FLEXERIL) 10 MG tablet, Take by mouth., Disp: , Rfl:    fluticasone (FLONASE) 50 MCG/ACT nasal spray, Place into the nose., Disp: , Rfl:    montelukast (SINGULAIR) 10 MG tablet, Take 10 mg by mouth daily., Disp: , Rfl:    naloxone (NARCAN) nasal spray 4 mg/0.1  mL, Place into the nose., Disp: , Rfl:    propranolol  (INDERAL ) 10 MG tablet, TAKE 1 TO 2 TABLETS BY MOUTH TWICE A DAY AS NEEDED FOR ANXIETY, Disp: 120 tablet, Rfl: 2   propranolol  (INDERAL ) 20 MG tablet, Take 1 tablet (20 mg total) by mouth 3 (three) times daily., Disp: 90 tablet, Rfl: 2   QUEtiapine  (SEROQUEL ) 100 MG tablet, Take 1 tablet by mouth at bedtime daily for insomnia and anxiety., Disp: 90 tablet, Rfl: 0   Vilazodone  HCl (VIIBRYD ) 40 MG TABS, Take 1 tablet (40 mg total) by mouth daily., Disp: 90 tablet, Rfl: 1 Medication Side Effects: none  Family Medical/ Social History: Changes? No  MENTAL HEALTH EXAM:  There were no vitals taken for this visit.There is no height or weight on file to calculate BMI.  General Appearance: Casual  Eye Contact:  Good  Speech:  Clear and Coherent  Volume:  Normal  Mood:  NA  Affect:  Appropriate  Thought Process:  Coherent  Orientation:  Full (Time, Place, and Person)  Thought Content: Logical   Suicidal Thoughts:  No  Homicidal Thoughts:  No  Memory:  WNL  Judgement:  Good  Insight:  Good  Psychomotor Activity:  Normal  Concentration:  Concentration: Good  Recall:  Good  Fund of Knowledge: Good  Language: Good  Assets:  Desire for Improvement  ADL's:  Intact  Cognition: WNL  Prognosis:  Good    DIAGNOSES:    ICD-10-CM   1. Generalized anxiety disorder  F41.1     2. Panic attack  F41.0  3. Mixed obsessional thoughts and acts  F42.2     4. Insomnia due to other mental disorder  F51.05    F99     5. 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 We discussed his continued good stability. Talked about a little less productive sleep wake cycle. He will modify his timing of Propranolol  appropriately. Discussed continuing with current medication  regimen while he is in transition with jobs. Understands that we will eventually wean down on xanax  when he obtains job. .  Patient does have a prescription of Narcan available since he also ha been on long term opioid pain medication for chronic back problems. Advised patient to not take Xanax  at same time as pain medication.  We agreed today to:   To continue  Viibryd  40 mg daily. Must take with food. To continue  Xanax  1 mg three times daily as needed. Will not increase from this dose. No early fills. Reviewed note from pharmacy.  To continue  Inderal  to 20 mg 1-3 tablets twice daily as needed for anxiety To continue with Seroquel  100 mg at bedtime for sleep Will continue BuSpar  15 mg 3 times daily Will report worsening symptoms or side effects promptly Provided emergency contact information To follow-up in 12 weeks to reassess after the holidays. Discussed potential benefits, risk, and side effects of benzodiazepines to include potential risk of tolerance and dependence, as well as possible drowsiness.  Advised patient not to drive if experiencing drowsiness and to take lowest possible effective dose to minimize risk of dependence and tolerance.  Discussed potential metabolic side effects associated with atypical antipsychotics, as well as potential risk for movement side effects. Advised pt to contact office if movement side effects occur.   Reviewed PDMP        Redell DELENA Pizza, NP

## 2024-10-11 ENCOUNTER — Telehealth: Payer: Self-pay | Admitting: Behavioral Health

## 2024-10-11 NOTE — Telephone Encounter (Signed)
 [place] called and said that he has gotten a job and is doing a lot of traveling.SABRA He would like to talk to Redell about how appts and refills will work. I did explain to him the protocol. Please  call him at [phone]

## 2024-10-12 NOTE — Telephone Encounter (Signed)
 Reviewed the process with patient. Told him he had to be in [place] for an appt, did not have to be in the office. Told him we could only send 30-day supply of alprazolam  to out of state pharmacy and some states would not accept an out of state Rx. He is asking if an early RF could be done for his alprazolam  for next week since he will be home for the TG. Told him that was a no go. Patient has asked for early RF multiple times and he has been told no early RF. He is probably moving to [facility] as his home [place] and said he was told that they wouldn't accept a controlled Rx. I told him that was not true, but where he can get every 28 days here, [facility] will not fill until day 29. There shouldn't be an issue with any noncontrolled medications to other states.

## 2024-11-05 ENCOUNTER — Other Ambulatory Visit: Payer: Self-pay | Admitting: Behavioral Health

## 2024-11-05 DIAGNOSIS — F411 Generalized anxiety disorder: Secondary | ICD-10-CM

## 2024-11-05 DIAGNOSIS — F410 Panic disorder [episodic paroxysmal anxiety] without agoraphobia: Secondary | ICD-10-CM

## 2024-11-30 ENCOUNTER — Ambulatory Visit: Admitting: Behavioral Health

## 2024-12-15 ENCOUNTER — Ambulatory Visit (INDEPENDENT_AMBULATORY_CARE_PROVIDER_SITE_OTHER): Admitting: Behavioral Health

## 2024-12-15 ENCOUNTER — Encounter: Payer: Self-pay | Admitting: Behavioral Health

## 2024-12-15 DIAGNOSIS — F410 Panic disorder [episodic paroxysmal anxiety] without agoraphobia: Secondary | ICD-10-CM

## 2024-12-15 DIAGNOSIS — F411 Generalized anxiety disorder: Secondary | ICD-10-CM

## 2024-12-15 MED ORDER — LORAZEPAM 2 MG PO TABS: 2.0000 mg | ORAL_TABLET | Freq: Four times a day (QID) | ORAL | 0 refills | Status: AC | PRN

## 2024-12-15 NOTE — Progress Notes (Signed)
 "     [facility]  Patient ID: Douglas Gonzalez,  MRN: [id number]  PCP: Millicent Sharper, MD  Date of Evaluation: 12/15/2024 Time spent:40 minutes  Chief Complaint:  Chief Complaint   Depression; Anxiety; Follow-up; Medication Refill; Patient Education; Medication Problem     HISTORY/CURRENT STATUS: HPI Douglas Gonzalez, 44 year old male presents to this office for follow-up and medication management.  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  He acknowledges that it has been very stressful and challenging getting situated and learning the ropes in his new position.  He was very forthcoming and candid and explaining that he had to take extra Xanax  during he meetings.  [place] that his wife is assisting in holding him accountable for taking the medication but he somehow was short 6 days this.  He is concerned about having withdrawals and negative symptoms during this period of adjustment with his new job.  He is requesting a small amount to cover the 6 days until his Xanax  is refillable.  He does express frustration and desires to wean off the medication as soon as possible however he does not see himself being able to do this for the next few months.  He also continues to see pain management for spinal pain.   He says his anxiety levels continue this visit 3/10, and depression is 3/10. Understands he can take 100 mg Seroquel  if needed or utilize propranolol  in the evening.  Requesting no med changes this visit, and will not consider any further until he is situated in new job.  He denies any current mania, no history of psychosis, no auditory or visual hallucinations.  He denies SI or HI.  He verbally contracts for safety with this clinical research associate.   Past psychiatric medication  trials: Zoloft Effexor Rexulti BuSpar  Xanax  Klonopin Celexa Lexapro Paxil Wellbutrin Seroquel  Abilify Lunesta Lamictal Ativan Propranolol  Hydroxyzine Gabapentin Vraylar BuSpar      Individual Medical History/ Review of Systems: Changes? :No   Allergies: Neosporin [bacitracin-polymyxin b]  Current Medications: Current Medications[1] Medication Side Effects: none  Family Medical/ Social History: Changes? No  MENTAL HEALTH EXAM:  There were no vitals taken for this visit.There is no height or weight on file to calculate BMI.  General Appearance: Casual, Neat, and Well Groomed  Eye Contact:  Good  Speech:  Clear and Coherent  Volume:  Normal  Mood:  Anxious  Affect:  Anxious  Thought Process:  Coherent  Orientation:  Full (Time, Place, and Person)  Thought Content: Logical   Suicidal Thoughts:  No  Homicidal Thoughts:  No  Memory:  WNL  Judgement:  Good  Insight:  Good  Psychomotor Activity:  Normal  Concentration:  Concentration: Good  Recall:  Good  Fund of Knowledge: Good  Language: Good  Assets:  Desire for Improvement  ADL's:  Intact  Cognition: WNL  Prognosis:  Good    DIAGNOSES:    ICD-10-CM   1. Panic attack  F41.0 LORazepam (ATIVAN) 2 MG tablet    2. Generalized anxiety disorder  F41.1 LORazepam (ATIVAN) 2 MG tablet      Receiving Psychotherapy: No    RECOMMENDATIONS:  Greater than 50% of  30  min face to face time with patient was spent on counseling and coordination of care.  We discussed his happiness and satisfaction with obtaining his new job.  Discussed his adjustment.  And increased anxiety during this period.  I agree that it is  probably not a good time to try weaning him off or at least to a more reasonable level of Xanax .  However I reinforced the importance that we would need to start accomplishing this goal when he gets more situated.  I also was very candid and explaining that I could not allow any more early fills or  replacements of his Xanax .  I did agree that I would bridge equivalent Ativan for the next 6 days.  He acknowledges that he does use his problematic and that he will have to address this issue in the next few months.  I agreed that I wanted to set him up for success and hopefully alleviate some of his anxiety since he is striving to be successful in his new position.  Patient does have a prescription of Narcan available since he also has been on long term opioid pain medication for chronic back problems. Advised patient to not take Xanax  at same time as pain medication.    We agreed today to:  #18, Ativan 2 mg 3 times daily as needed for 6 days, then resume regular Rx of Xanax .  One time bridge only.  To continue  Viibryd  40 mg daily. Must take with food.  To continue  Xanax  1 mg three times daily as needed. Will not increase from this dose. No early fills.   To continue  Inderal  to 20 mg 1-3 tablets twice daily as needed for anxiety To continue with Seroquel  100 mg at bedtime for sleep Will continue BuSpar  15 mg 3 times daily Will report worsening symptoms or side effects promptly Provided emergency contact information To follow-up in 12 weeks to reassess after the holidays. Discussed potential benefits, risk, and side effects of benzodiazepines to include potential risk of tolerance and dependence, as well as possible drowsiness.  Advised patient not to drive if experiencing drowsiness and to take lowest possible effective dose to minimize risk of dependence and tolerance.  Discussed potential metabolic side effects associated with atypical antipsychotics, as well as potential risk for movement side effects. Advised pt to contact office if movement side effects occur.   Reviewed PDMP      Douglas DELENA Pizza, NP     [1]  Current Outpatient Medications:    LORazepam (ATIVAN) 2 MG tablet, Take 1 tablet (2 mg total) by mouth every 6 (six) hours as needed for anxiety., Disp: 18 tablet, Rfl: 0    ALPRAZolam  (XANAX ) 1 MG tablet, Take 1 tablet (1 mg total) by mouth 3 (three) times daily as needed for anxiety., Disp: 90 tablet, Rfl: 3   busPIRone  (BUSPAR ) 15 MG tablet, Take 1 tablet (15 mg total) by mouth 3 (three) times daily., Disp: 90 tablet, Rfl: 1   cyclobenzaprine (FLEXERIL) 10 MG tablet, Take by mouth., Disp: , Rfl:    fluticasone (FLONASE) 50 MCG/ACT nasal spray, Place into the nose., Disp: , Rfl:    montelukast (SINGULAIR) 10 MG tablet, Take 10 mg by mouth daily., Disp: , Rfl:    naloxone (NARCAN) nasal spray 4 mg/0.1 mL, Place into the nose., Disp: , Rfl:    propranolol  (INDERAL ) 10 MG tablet, TAKE 1 TO 2 TABLETS BY MOUTH TWICE A DAY AS NEEDED FOR ANXIETY, Disp: 120 tablet, Rfl: 2   propranolol  (INDERAL ) 20 MG tablet, TAKE ONE TABLET BY MOUTH THREE TIMES DAILY, Disp: 90 tablet, Rfl: 1   QUEtiapine  (SEROQUEL ) 100 MG tablet, Take 1 tablet by mouth at bedtime daily for insomnia and anxiety., Disp: 90 tablet, Rfl: 0  Vilazodone  HCl (VIIBRYD ) 40 MG TABS, Take 1 tablet (40 mg total) by mouth daily., Disp: 90 tablet, Rfl: 1  "

## 2024-12-16 ENCOUNTER — Emergency Department (HOSPITAL_COMMUNITY)

## 2024-12-16 ENCOUNTER — Encounter (HOSPITAL_COMMUNITY): Payer: Self-pay

## 2024-12-16 ENCOUNTER — Other Ambulatory Visit: Payer: Self-pay

## 2024-12-16 ENCOUNTER — Emergency Department (HOSPITAL_COMMUNITY)
Admission: EM | Admit: 2024-12-16 | Discharge: 2024-12-16 | Disposition: A | Discharge status: Home / Self Care | Attending: Emergency Medicine | Admitting: Emergency Medicine

## 2024-12-16 DIAGNOSIS — F1010 Alcohol abuse, uncomplicated: Secondary | ICD-10-CM | POA: Diagnosis not present

## 2024-12-16 DIAGNOSIS — S40212A Abrasion of left shoulder, initial encounter: Secondary | ICD-10-CM | POA: Insufficient documentation

## 2024-12-16 DIAGNOSIS — Y92410 Unspecified street and highway as the place of occurrence of the external cause: Secondary | ICD-10-CM | POA: Insufficient documentation

## 2024-12-16 DIAGNOSIS — I7121 Aneurysm of the ascending aorta, without rupture: Secondary | ICD-10-CM | POA: Diagnosis not present

## 2024-12-16 DIAGNOSIS — M25512 Pain in left shoulder: Secondary | ICD-10-CM | POA: Diagnosis present

## 2024-12-16 DIAGNOSIS — M545 Low back pain, unspecified: Secondary | ICD-10-CM | POA: Diagnosis not present

## 2024-12-16 DIAGNOSIS — Y908 Blood alcohol level of 240 mg/100 ml or more: Secondary | ICD-10-CM | POA: Insufficient documentation

## 2024-12-16 DIAGNOSIS — S12501A Unspecified nondisplaced fracture of sixth cervical vertebra, initial encounter for closed fracture: Secondary | ICD-10-CM | POA: Insufficient documentation

## 2024-12-16 LAB — I-STAT CG4 LACTIC ACID, ED: Lactic Acid, Venous: 2 mmol/L (ref 0.5–1.9)

## 2024-12-16 LAB — I-STAT CHEM 8, ED
BUN: 6 mg/dL (ref 6–20)
Calcium, Ion: 1.02 mmol/L — ABNORMAL LOW (ref 1.15–1.40)
Chloride: 105 mmol/L (ref 98–111)
Creatinine, Ser: 1.5 mg/dL — ABNORMAL HIGH (ref 0.61–1.24)
Glucose, Bld: 89 mg/dL (ref 70–99)
HCT: 45 % (ref 39.0–52.0)
Hemoglobin: 15.3 g/dL (ref 13.0–17.0)
Potassium: 3.8 mmol/L (ref 3.5–5.1)
Sodium: 144 mmol/L (ref 135–145)
TCO2: 26 mmol/L (ref 22–32)

## 2024-12-16 LAB — PROTIME-INR
INR: 1 (ref 0.8–1.2)
Prothrombin Time: 13.3 s (ref 11.4–15.2)

## 2024-12-16 LAB — COMPREHENSIVE METABOLIC PANEL WITH GFR
ALT: 27 U/L (ref 0–44)
AST: 58 U/L — ABNORMAL HIGH (ref 15–41)
Albumin: 4.7 g/dL (ref 3.5–5.0)
Alkaline Phosphatase: 56 U/L (ref 38–126)
Anion gap: 13 (ref 5–15)
BUN: 7 mg/dL (ref 6–20)
CO2: 24 mmol/L (ref 22–32)
Calcium: 9.3 mg/dL (ref 8.9–10.3)
Chloride: 104 mmol/L (ref 98–111)
Creatinine, Ser: 1.03 mg/dL (ref 0.61–1.24)
GFR, Estimated: >60 mL/min
Glucose, Bld: 88 mg/dL (ref 70–99)
Potassium: 4.1 mmol/L (ref 3.5–5.1)
Sodium: 141 mmol/L (ref 135–145)
Total Bilirubin: 0.8 mg/dL (ref 0.0–1.2)
Total Protein: 7.8 g/dL (ref 6.5–8.1)

## 2024-12-16 LAB — CBC
HCT: 45.2 % (ref 39.0–52.0)
Hemoglobin: 15.2 g/dL (ref 13.0–17.0)
MCH: 30.2 pg (ref 26.0–34.0)
MCHC: 33.6 g/dL (ref 30.0–36.0)
MCV: 89.7 fL (ref 80.0–100.0)
Platelets: 300 10*3/uL (ref 150–400)
RBC: 5.04 MIL/uL (ref 4.22–5.81)
RDW: 12 % (ref 11.5–15.5)
WBC: 7.8 10*3/uL (ref 4.0–10.5)
nRBC: 0 % (ref 0.0–0.2)

## 2024-12-16 LAB — SAMPLE TO BLOOD BANK

## 2024-12-16 LAB — ETHANOL: Alcohol, Ethyl (B): 297 mg/dL — ABNORMAL HIGH

## 2024-12-16 MED ORDER — NAPROXEN 500 MG PO TABS: 500.0000 mg | ORAL_TABLET | Freq: Two times a day (BID) | ORAL | 0 refills | Status: AC | PRN

## 2024-12-16 MED ORDER — LACTATED RINGERS IV BOLUS
1000.0000 mL | Freq: Once | INTRAVENOUS | Status: AC
  Administered 2024-12-16: 1000 mL via INTRAVENOUS

## 2024-12-16 MED ORDER — HYDROCODONE-ACETAMINOPHEN 5-325 MG PO TABS
1.0000 | ORAL_TABLET | Freq: Once | ORAL | Status: AC
  Administered 2024-12-16: 1 via ORAL
  Filled 2024-12-16: qty 1

## 2024-12-16 MED ORDER — KETOROLAC TROMETHAMINE 15 MG/ML IJ SOLN
15.0000 mg | Freq: Once | INTRAMUSCULAR | Status: AC
  Administered 2024-12-16: 15 mg via INTRAVENOUS
  Filled 2024-12-16: qty 1

## 2024-12-16 MED ORDER — IOHEXOL 350 MG/ML SOLN
75.0000 mL | Freq: Once | INTRAVENOUS | Status: AC | PRN
  Administered 2024-12-16: 75 mL via INTRAVENOUS

## 2024-12-16 MED ORDER — HYDROCODONE-ACETAMINOPHEN 5-325 MG PO TABS: 1.0000 | ORAL_TABLET | Freq: Four times a day (QID) | ORAL | 0 refills | Status: DC | PRN

## 2024-12-16 NOTE — ED Notes (Signed)
 Patient wife came out and said he removed his c-collar

## 2024-12-16 NOTE — Progress Notes (Signed)
 [facility] Progress Note Patient Details:  Douglas Gonzalez [birth year] [id number]  Patient ID: Douglas Gonzalez, male   DOB: 24-Aug-1981, 44 y.o.   MRN: [id number] I attended trauma page. Chandra Dorn PARAS 12/16/2024, 8:35 PM
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
## 2024-12-16 NOTE — Discharge Instructions (Addendum)
 Need to wear the cervical collar at all times except you can take it off to shower.  You can take the muscle relaxer and anti-inflammatory as needed for pain.  Tomorrow you are going to hurt all over but there is no other signs of internal injury.   You will need to follow-up with neurosurgery in 2 weeks and they will be able to reevaluate you and decide if you can come out of the collar.  If you start to develop numbness or weakness in your arms or legs or difficulty walking you should return to the emergency room immediately. You do have some dilation of your aorta that they saw when they did the scan which just needs to be checked yearly by your regular doctor.
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
## 2024-12-16 NOTE — ED Notes (Signed)
 Pt observed trying to remove C-collar. This Water Engineer NT explained to pt that C collar needed to remain in place until the provider says it can be removed. 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

## 2024-12-16 NOTE — ED Notes (Signed)
 Pt repeatedly asking for more pain medicine. Advised pt that we just administered pain meds and that it would not be safe given that he is highly intoxicated to administer anything stronger to which pt replied I drink plenty of water so that's not a problem.

## 2024-12-17 ENCOUNTER — Telehealth (HOSPITAL_COMMUNITY): Payer: Self-pay | Admitting: Emergency Medicine

## 2024-12-17 MED ORDER — HYDROCODONE-ACETAMINOPHEN 5-325 MG PO TABS: 1.0000 | ORAL_TABLET | Freq: Four times a day (QID) | ORAL | 0 refills | Status: AC | PRN

## 2024-12-17 MED ORDER — SENNOSIDES-DOCUSATE SODIUM 8.6-50 MG PO TABS: 1.0000 | ORAL_TABLET | Freq: Every evening | ORAL | 0 refills | Status: AC | PRN

## 2024-12-17 NOTE — Telephone Encounter (Signed)
 Sending to alternate pharmacy
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
## 2025-03-15 ENCOUNTER — Ambulatory Visit: Admitting: Behavioral Health
